# Patient Record
Sex: Female | Born: 1940 | Race: White | Hispanic: No | Marital: Married | Smoking: Never smoker
Health system: Southern US, Community
[De-identification: ages and names within clinical notes are randomized; demographics above are authoritative.]

## PROBLEM LIST (undated history)

## (undated) DIAGNOSIS — M1990 Unspecified osteoarthritis, unspecified site: Secondary | ICD-10-CM

## (undated) DIAGNOSIS — E559 Vitamin D deficiency, unspecified: Secondary | ICD-10-CM

## (undated) DIAGNOSIS — D126 Benign neoplasm of colon, unspecified: Secondary | ICD-10-CM

## (undated) DIAGNOSIS — E039 Hypothyroidism, unspecified: Secondary | ICD-10-CM

## (undated) DIAGNOSIS — M2550 Pain in unspecified joint: Secondary | ICD-10-CM

## (undated) DIAGNOSIS — D509 Iron deficiency anemia, unspecified: Secondary | ICD-10-CM

## (undated) DIAGNOSIS — R002 Palpitations: Secondary | ICD-10-CM

## (undated) DIAGNOSIS — R0602 Shortness of breath: Secondary | ICD-10-CM

## (undated) DIAGNOSIS — R5383 Other fatigue: Secondary | ICD-10-CM

## (undated) DIAGNOSIS — U071 COVID-19: Secondary | ICD-10-CM

## (undated) DIAGNOSIS — E079 Disorder of thyroid, unspecified: Secondary | ICD-10-CM

## (undated) DIAGNOSIS — I10 Essential (primary) hypertension: Secondary | ICD-10-CM

## (undated) DIAGNOSIS — E669 Obesity, unspecified: Secondary | ICD-10-CM

## (undated) DIAGNOSIS — M25475 Effusion, left foot: Secondary | ICD-10-CM

## (undated) DIAGNOSIS — G4730 Sleep apnea, unspecified: Secondary | ICD-10-CM

## (undated) DIAGNOSIS — M25474 Effusion, right foot: Secondary | ICD-10-CM

## (undated) DIAGNOSIS — R197 Diarrhea, unspecified: Secondary | ICD-10-CM

## (undated) DIAGNOSIS — K56609 Unspecified intestinal obstruction, unspecified as to partial versus complete obstruction: Secondary | ICD-10-CM

## (undated) DIAGNOSIS — M25472 Effusion, left ankle: Secondary | ICD-10-CM

## (undated) DIAGNOSIS — G5603 Carpal tunnel syndrome, bilateral upper limbs: Principal | ICD-10-CM

## (undated) DIAGNOSIS — E7800 Pure hypercholesterolemia, unspecified: Secondary | ICD-10-CM

## (undated) DIAGNOSIS — K5792 Diverticulitis of intestine, part unspecified, without perforation or abscess without bleeding: Secondary | ICD-10-CM

## (undated) DIAGNOSIS — R079 Chest pain, unspecified: Secondary | ICD-10-CM

## (undated) DIAGNOSIS — M25471 Effusion, right ankle: Secondary | ICD-10-CM

## (undated) DIAGNOSIS — H6120 Impacted cerumen, unspecified ear: Secondary | ICD-10-CM

## (undated) DIAGNOSIS — R252 Cramp and spasm: Secondary | ICD-10-CM

## (undated) DIAGNOSIS — K219 Gastro-esophageal reflux disease without esophagitis: Secondary | ICD-10-CM

## (undated) DIAGNOSIS — F439 Reaction to severe stress, unspecified: Secondary | ICD-10-CM

## (undated) DIAGNOSIS — K760 Fatty (change of) liver, not elsewhere classified: Secondary | ICD-10-CM

## (undated) DIAGNOSIS — F419 Anxiety disorder, unspecified: Secondary | ICD-10-CM

## (undated) HISTORY — DX: Shortness of breath: R06.02

## (undated) HISTORY — DX: Palpitations: R00.2

## (undated) HISTORY — DX: Cramp and spasm: R25.2

## (undated) HISTORY — DX: Sleep apnea, unspecified: G47.30

## (undated) HISTORY — PX: OTHER SURGICAL HISTORY: SHX169

## (undated) HISTORY — DX: Effusion, left ankle: M25.472

## (undated) HISTORY — DX: Disorder of thyroid, unspecified: E07.9

## (undated) HISTORY — DX: Gastro-esophageal reflux disease without esophagitis: K21.9

## (undated) HISTORY — DX: Effusion, right ankle: M25.471

## (undated) HISTORY — DX: Unspecified intestinal obstruction, unspecified as to partial versus complete obstruction: K56.609

## (undated) HISTORY — DX: Impacted cerumen, unspecified ear: H61.20

## (undated) HISTORY — DX: Vitamin D deficiency, unspecified: E55.9

## (undated) HISTORY — DX: Chest pain, unspecified: R07.9

## (undated) HISTORY — DX: Obesity, unspecified: E66.9

## (undated) HISTORY — DX: Carpal tunnel syndrome, bilateral upper limbs: G56.03

## (undated) HISTORY — PX: APPENDECTOMY: SHX54

## (undated) HISTORY — DX: Effusion, left ankle: M25.475

## (undated) HISTORY — DX: Effusion, right foot: M25.474

## (undated) HISTORY — DX: Diverticulitis of intestine, part unspecified, without perforation or abscess without bleeding: K57.92

## (undated) HISTORY — DX: Other fatigue: R53.83

## (undated) HISTORY — DX: Reaction to severe stress, unspecified: F43.9

## (undated) HISTORY — DX: Iron deficiency anemia, unspecified: D50.9

## (undated) HISTORY — DX: Unspecified osteoarthritis, unspecified site: M19.90

## (undated) HISTORY — PX: CATARACT EXTRACTION W/ INTRAOCULAR LENS IMPLANT: SHX1309

## (undated) HISTORY — DX: Benign neoplasm of colon, unspecified: D12.6

## (undated) HISTORY — DX: Diarrhea, unspecified: R19.7

## (undated) HISTORY — DX: Pain in unspecified joint: M25.50

## (undated) HISTORY — DX: COVID-19: U07.1

## (undated) HISTORY — DX: Anxiety disorder, unspecified: F41.9

## (undated) HISTORY — DX: Hypothyroidism, unspecified: E03.9

## (undated) HISTORY — DX: Pure hypercholesterolemia, unspecified: E78.00

## (undated) HISTORY — PX: TOE SURGERY: SHX1073

## (undated) HISTORY — DX: Fatty (change of) liver, not elsewhere classified: K76.0

## (undated) HISTORY — DX: Essential (primary) hypertension: I10

## (undated) HISTORY — PX: COLONOSCOPY: SHX174

---

## 1984-02-20 HISTORY — PX: ABDOMINAL HYSTERECTOMY: SHX81

## 1997-05-31 ENCOUNTER — Ambulatory Visit (HOSPITAL_COMMUNITY): Admission: RE | Admit: 1997-05-31 | Discharge: 1997-05-31 | Payer: Self-pay | Admitting: Internal Medicine

## 1998-01-06 ENCOUNTER — Inpatient Hospital Stay (HOSPITAL_COMMUNITY): Admission: EM | Admit: 1998-01-06 | Discharge: 1998-01-08 | Payer: Self-pay | Admitting: Emergency Medicine

## 1998-01-09 ENCOUNTER — Inpatient Hospital Stay (HOSPITAL_COMMUNITY): Admission: EM | Admit: 1998-01-09 | Discharge: 1998-01-11 | Payer: Self-pay | Admitting: Emergency Medicine

## 2003-01-28 ENCOUNTER — Encounter (INDEPENDENT_AMBULATORY_CARE_PROVIDER_SITE_OTHER): Payer: Self-pay | Admitting: *Deleted

## 2003-01-28 ENCOUNTER — Ambulatory Visit (HOSPITAL_COMMUNITY): Admission: RE | Admit: 2003-01-28 | Discharge: 2003-01-28 | Payer: Self-pay | Admitting: *Deleted

## 2003-02-13 ENCOUNTER — Ambulatory Visit (HOSPITAL_COMMUNITY): Admission: RE | Admit: 2003-02-13 | Discharge: 2003-02-13 | Payer: Self-pay | Admitting: Neurosurgery

## 2004-07-08 ENCOUNTER — Emergency Department (HOSPITAL_COMMUNITY): Admission: EM | Admit: 2004-07-08 | Discharge: 2004-07-08 | Payer: Self-pay | Admitting: Emergency Medicine

## 2009-04-01 ENCOUNTER — Ambulatory Visit: Payer: Self-pay | Admitting: Family Medicine

## 2009-04-01 DIAGNOSIS — E039 Hypothyroidism, unspecified: Secondary | ICD-10-CM | POA: Insufficient documentation

## 2009-04-01 DIAGNOSIS — J209 Acute bronchitis, unspecified: Secondary | ICD-10-CM

## 2009-04-01 DIAGNOSIS — R05 Cough: Secondary | ICD-10-CM

## 2009-04-01 DIAGNOSIS — Z8719 Personal history of other diseases of the digestive system: Secondary | ICD-10-CM

## 2009-04-01 DIAGNOSIS — R059 Cough, unspecified: Secondary | ICD-10-CM | POA: Insufficient documentation

## 2009-04-01 DIAGNOSIS — I10 Essential (primary) hypertension: Secondary | ICD-10-CM | POA: Insufficient documentation

## 2009-04-01 DIAGNOSIS — K5730 Diverticulosis of large intestine without perforation or abscess without bleeding: Secondary | ICD-10-CM | POA: Insufficient documentation

## 2009-04-04 ENCOUNTER — Ambulatory Visit: Payer: Self-pay | Admitting: Occupational Medicine

## 2009-04-04 ENCOUNTER — Telehealth (INDEPENDENT_AMBULATORY_CARE_PROVIDER_SITE_OTHER): Payer: Self-pay

## 2009-08-02 IMAGING — US MAMMO-LUNI-US
1 series · 14 of 16 positions shown · non-contrast
Comparison: NONE

CLINICAL DATA: BERGLUND.C.Oneill RT(R)(M)(CT)   Diagnostic Mammogram.  
Question  of increasing left breast asymmetry versus compression 
differences  noted on screening mammogram dated 04/30/2007. 

LEFT BREAST MAMMOGRAM ADDITIONAL VIEWS AND LEFT BREAST ULTRASOUND

[Series 1: us left breast · 0.07mm/px · 14 of 20 slices shown]
[im 1/20]
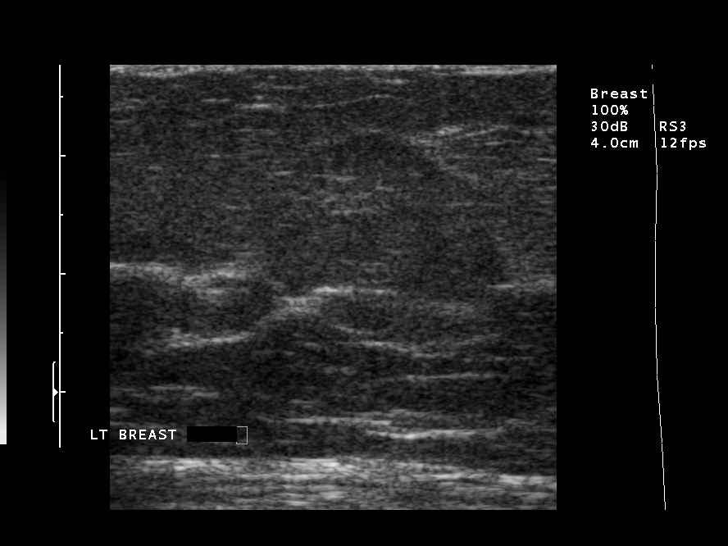
[im 2/20]
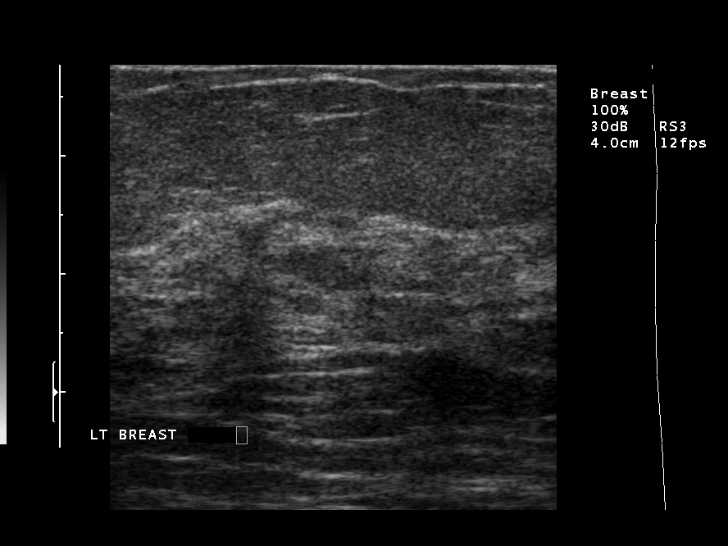
[im 3/20]
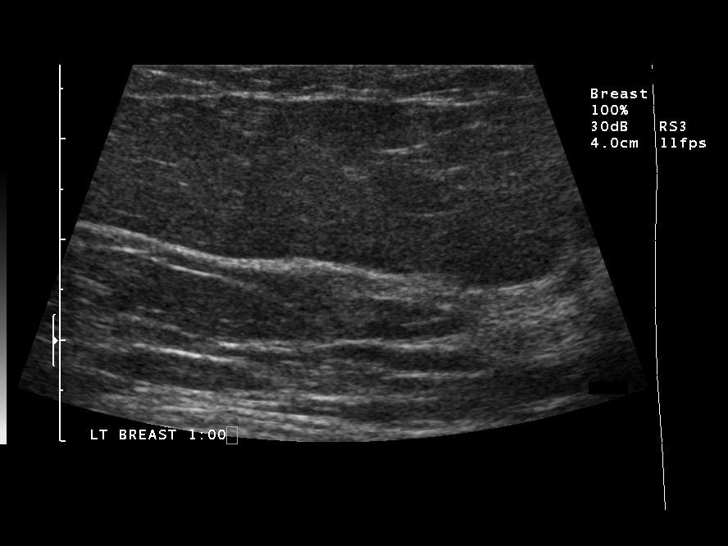
[im 6/20]
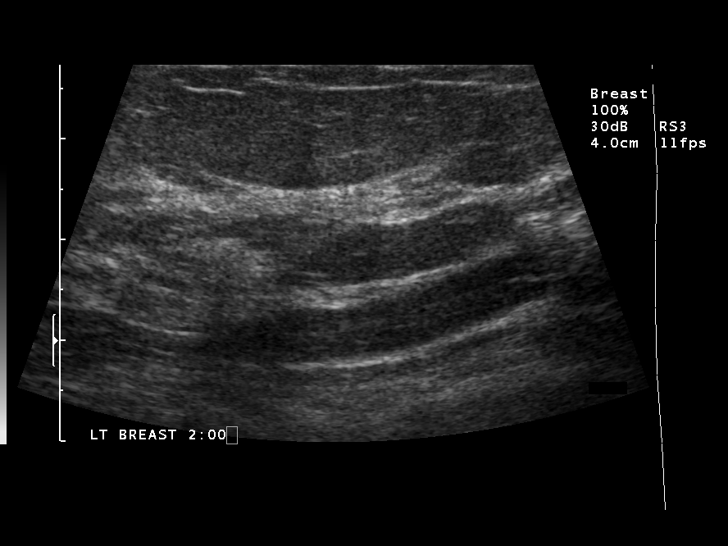
[im 7/20]
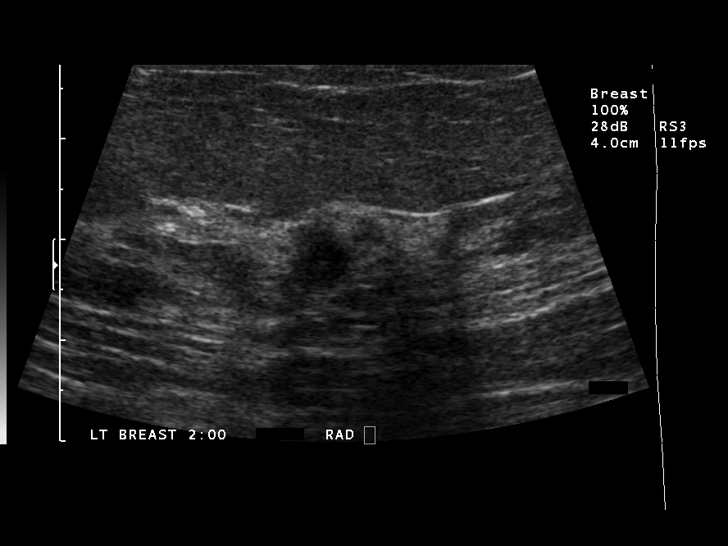
[im 8/20]
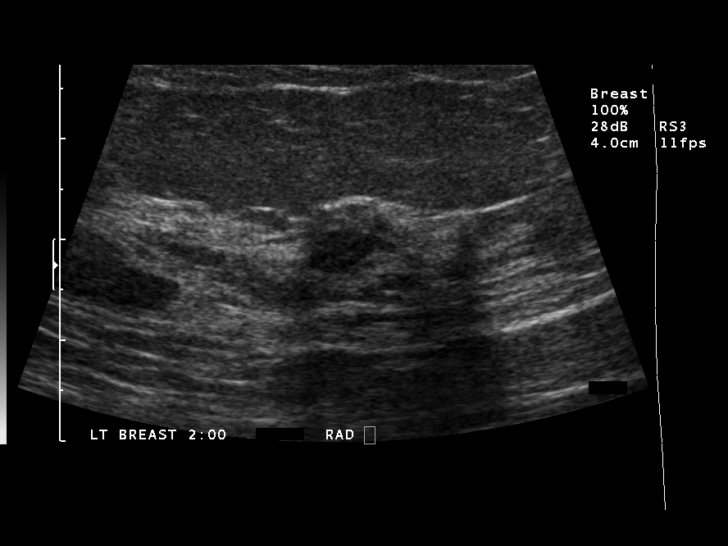
[im 9/20]
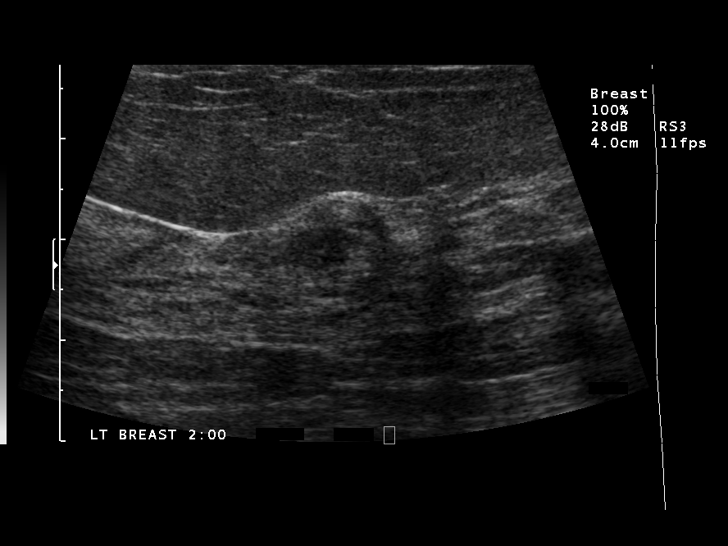
[im 11/20]
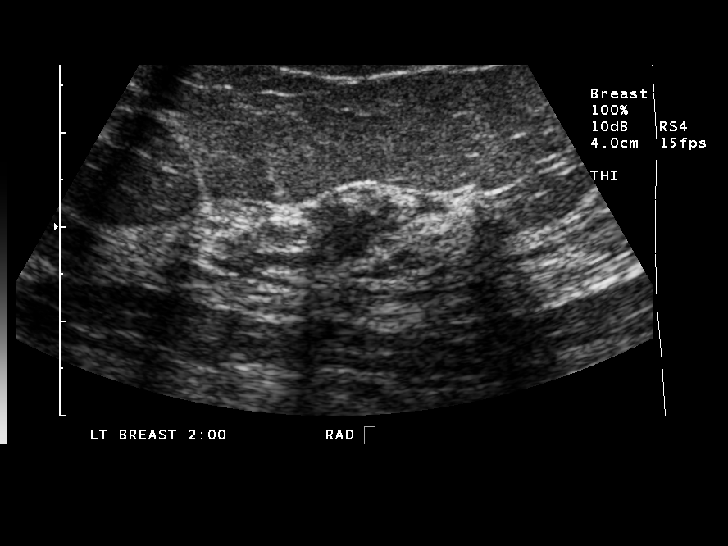
[im 12/20]
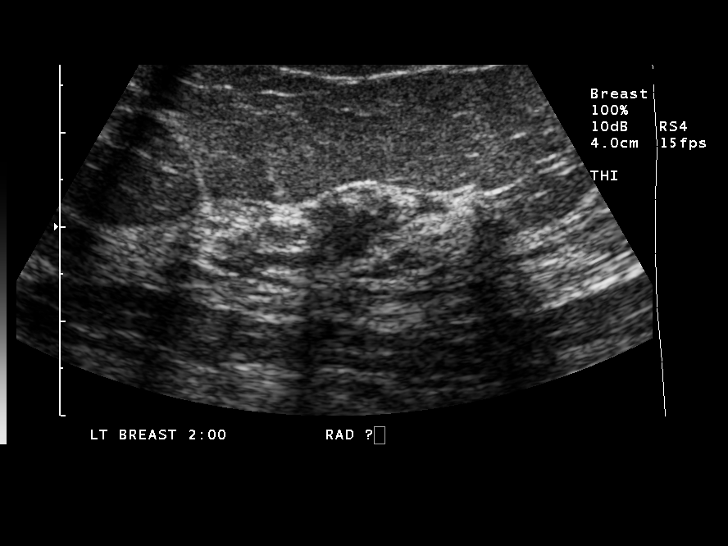
[im 13/20]
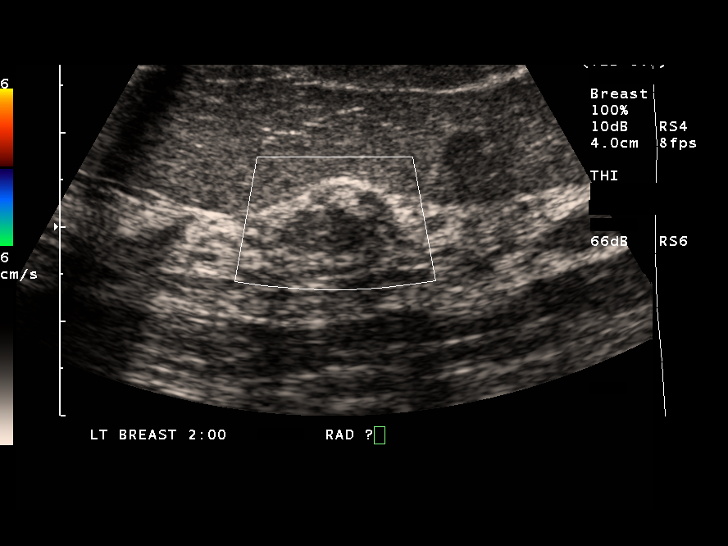
[im 16/20]
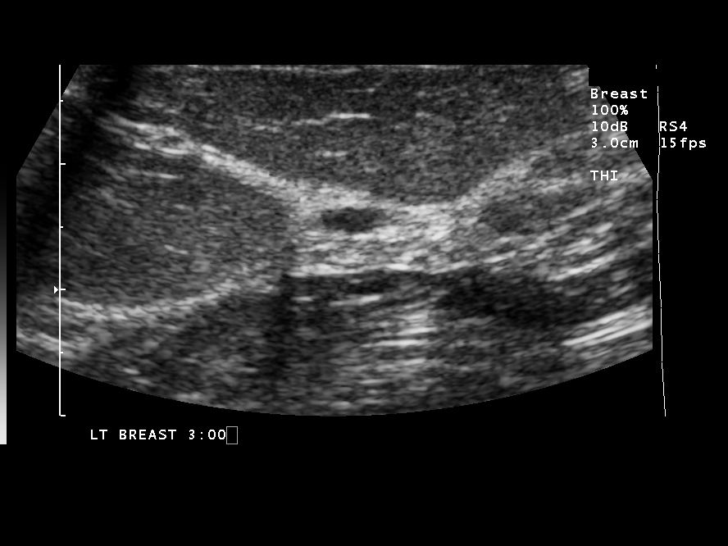
[im 17/20]
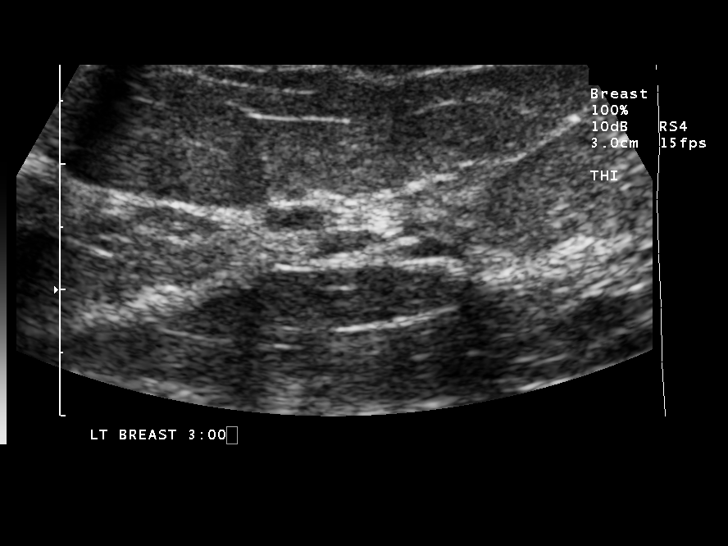
[im 18/20]
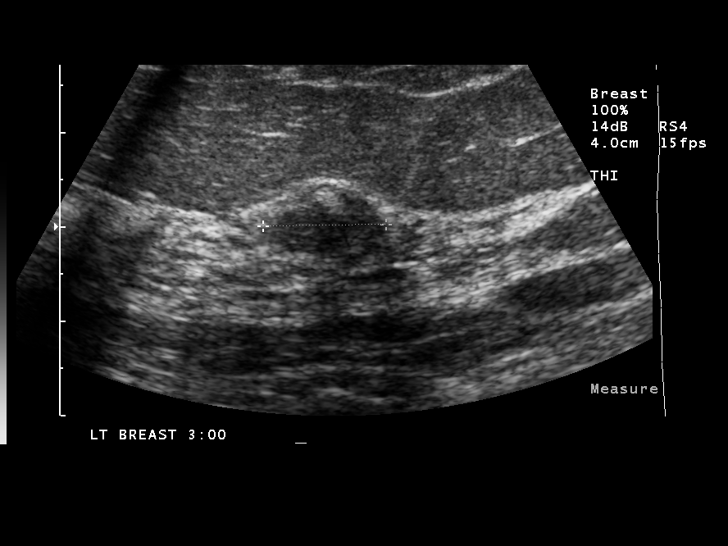
[im 20/20]
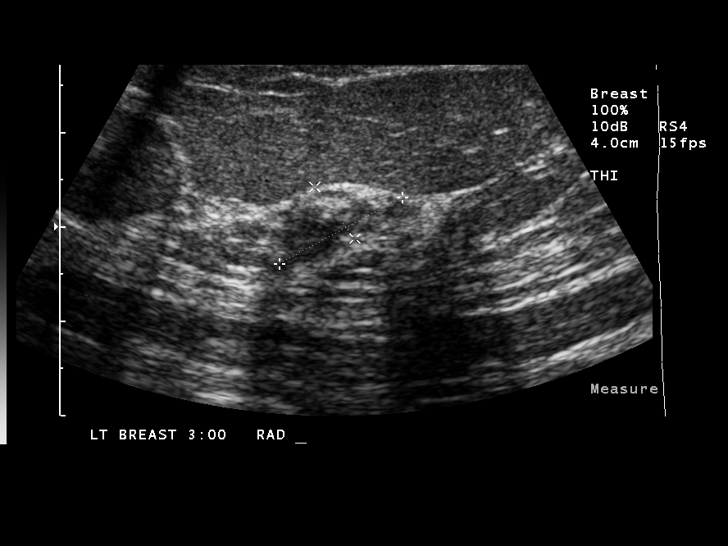

[14 of 16 positions shown; findings below may reference images not displayed]

FINDINGS: True lateral view of the left breast demonstrates an 
area of probable fibroglandular asymmetry related to fibrocystic 
changes.  No architectural distortion, spiculation, suspicious 
mass or microcalcification. Ultrasound demonstrates an area of 
altered echogenicity most likely reflecting fibrocystic change.  
No other abnormality.
IMPRESSION: Probable benign change in the left breast.  
Follow-up left breast mammogram and ultrasound in six-months is 
recommended. The patient was informed at the time of the 
examination of the findings and recommendations by verbal and 
written lay report. Computer assisted (Second Look) technology was 
used as an aid in interpretation of this study. BI-RADS 3 - 
Probably Benign Nickelsen, Jeppe Juel electronically reviewed on 
05/08/2007 Dict Date: 05/08/2007  Tran Date:  05/08/2007 INGUNN HARPA RONLOR

## 2010-01-26 ENCOUNTER — Ambulatory Visit (HOSPITAL_COMMUNITY)
Admission: RE | Admit: 2010-01-26 | Discharge: 2010-01-26 | Payer: Self-pay | Source: Home / Self Care | Attending: Gastroenterology | Admitting: Gastroenterology

## 2010-03-23 NOTE — Assessment & Plan Note (Signed)
Summary: Allergic Reaction to antibiotic rm 1   Vital Signs:  Patient Profile:   70 Years Old Female CC:      Cold & URI symptoms Height:     63.5 inches Weight:      191 pounds O2 Sat:      94 % O2 treatment:    Room Air Temp:     97.4 degrees F oral Pulse rate:   70 / minute Pulse rhythm:   regular Resp:     15 per minute BP sitting:   138 / 92  (right arm) Cuff size:   regular  Vitals Entered By: Areta Haber CMA (April 04, 2009 10:26 AM)                  Current Allergies: ! * UNKNOWN   History of Present Illness Chief Complaint: Cold & URI symptoms History of Present Illness: Seen here three days ago with flu like symptoms and complaints of cough/fever.   Non smoker.   She was prescribed Omnicef and was ablt to take it for 3 days.   Yesterday she developed swelling of her lips and chelosis.   She stopped the Omnicef.   Still complaining of dry cough and cough keeps her up at night.   She does not have an anti tussive.   No prior history of asthma.   No fever in 3-4 days.   Current Problems: ALLERGIC REACTION (ICD-995.3) COUGH (ICD-786.2) ACUTE BRONCHITIS (ICD-466.0) COUGH (ICD-786.2) HYPOTHYROIDISM (ICD-244.9) HYPERTENSION (ICD-401.9) DIVERTICULITIS, HX OF (ICD-V12.79)   Current Meds SYNTHROID 25 MCG TABS (LEVOTHYROXINE SODIUM) once daily DIOVAN HCT 80-12.5 MG TABS (VALSARTAN-HYDROCHLOROTHIAZIDE) once daily DELSYM NIGHT TIME COUGH/COLD 15-6.25-500 MG/15ML LIQD (DM-DOXYLAMINE-ACETAMINOPHEN) prn CEFDINIR 300 MG CAPS (CEFDINIR) 1 by mouth q12hr BENZONATATE 200 MG CAPS (BENZONATATE) One by mouth hs as needed cough CHERATUSSIN DAC 30-10-100 MG/5ML SOLN (PSEUDOEPHEDRINE-CODEINE-GG) 5 ml at bedtime for cough MAXAIR AUTOHALER 200 MCG/INH AERB (PIRBUTEROL ACETATE) 1 - 2 puffs q4 to 6hr prn  REVIEW OF SYSTEMS Constitutional Symptoms      Denies fever, chills, night sweats, weight loss, weight gain, and fatigue.  Eyes       Denies change in vision, eye pain,  eye discharge, glasses, contact lenses, and eye surgery. Ear/Nose/Throat/Mouth       Denies hearing loss/aids, change in hearing, ear pain, ear discharge, dizziness, frequent runny nose, frequent nose bleeds, sinus problems, sore throat, hoarseness, and tooth pain or bleeding.  Respiratory       Complains of dry cough.      Denies productive cough, wheezing, shortness of breath, asthma, bronchitis, and emphysema/COPD.  Cardiovascular       Denies murmurs, chest pain, and tires easily with exhertion.    Gastrointestinal       Denies stomach pain, nausea/vomiting, diarrhea, constipation, blood in bowel movements, and indigestion. Genitourniary       Denies painful urination, kidney stones, and loss of urinary control. Neurological       Denies paralysis, seizures, and fainting/blackouts. Musculoskeletal       Denies muscle pain, joint pain, joint stiffness, decreased range of motion, redness, swelling, muscle weakness, and gout.  Skin       Denies bruising, unusual mles/lumps or sores, and hair/skin or nail changes.      Comments: Allergic Reaction  Psych       Denies mood changes, temper/anger issues, anxiety/stress, speech problems, depression, and sleep problems. Other Comments: Pt states that broke out around mouth, lips, inside of mouth on  Saturday 04/02/09.  Pt states she feels she's having an allergic reaction to antibiotic.   Past History:  Past Medical History: Last updated: 04/01/2009 Diverticulitis, hx of Hypertension Hypothyroidism  Past Surgical History: Last updated: 04/01/2009 Hysterectomy-1987  Family History: Last updated: 04/01/2009 MI-father  Social History: Last updated: 04/01/2009 Occupation:[employer] Married Never Smoked Alcohol use-no Drug use-no Regular exercise-yes  Risk Factors: Exercise: yes (04/01/2009)  Risk Factors: Smoking Status: never (04/01/2009) Physical Exam General appearance: well developed, well nourished, no acute  distress Oral/Pharynx: swelling of the upper lip.   Some chelosis noted.   No airway compromise.   Normal tonsils and posterior pharynx.  Chest/Lungs: no rales, wheezes, or rhonchi bilateral, breath sounds equal without effort Heart: regular rate and  rhythm, no murmur Assessment New Problems: ALLERGIC REACTION (ICD-995.3) COUGH (ICD-786.2)   Plan New Medications/Changes: MAXAIR AUTOHALER 200 MCG/INH AERB (PIRBUTEROL ACETATE) 1 - 2 puffs q4 to 6hr prn  #1 MDI x 0, 04/04/2009, Kathrine Haddock MD CHERATUSSIN DAC 30-10-100 MG/5ML SOLN (PSEUDOEPHEDRINE-CODEINE-GG) 5 ml at bedtime for cough  #4oz x 0, 04/04/2009, Kathrine Haddock MD  New Orders: Est. Patient Level III [phone] Planning Comments:   Maxair  MDI 2 pufs every 6 hours for cough Cheratussin at night Stop Omnicef Somumedrol 125mg  IM now Follow up if symptoms do not improve in 3-5 days.   The patient and/or caregiver has been counseled thoroughly with regard to medications prescribed including dosage, schedule, interactions, rationale for use, and possible side effects and they verbalize understanding.  Diagnoses and expected course of recovery discussed and will return if not improved as expected or if the condition worsens. Patient and/or caregiver verbalized understanding.  Prescriptions: MAXAIR AUTOHALER 200 MCG/INH AERB (PIRBUTEROL ACETATE) 1 - 2 puffs q4 to 6hr prn  #1 MDI x 0   Entered and Authorized by:   Kathrine Haddock MD   Signed by:   Kathrine Haddock MD on 04/04/2009   Method used:   Print then Give to Patient   RxID:   [id number] CHERATUSSIN DAC 30-10-100 MG/5ML SOLN (PSEUDOEPHEDRINE-CODEINE-GG) 5 ml at bedtime for cough  #4oz x 0   Entered and Authorized by:   Kathrine Haddock MD   Signed by:   Kathrine Haddock MD on 04/04/2009   Method used:   Print then Give to Patient   RxID:   [id number]   Appended Document: Allergic Reaction to antibiotic rm 1 Solu Medrol 125mg  IM R Glut @ 11:25am LOT#  [id number]   EXP:   10/2011  MANUF: Pharmacia Pt Tolerated injection well.

## 2010-03-23 NOTE — Assessment & Plan Note (Signed)
Summary: COUGH/KH   Vital Signs:  Patient Profile:   70 Years Old Female CC:      productive cough, wheezing, hoarsness, fever, HA X 5 days Height:     63.5 inches Weight:      190 pounds O2 Sat:      96 % O2 treatment:    Room Air Temp:     99.0 degrees F oral Pulse rate:   88 / minute Pulse rhythm:   regular Resp:     18 per minute BP sitting:   122 / 89  (right arm) Cuff size:   regular  Pt. in pain?   no  Vitals Entered By: Lita Mains, RN                   Updated Prior Medication List: SYNTHROID 25 MCG TABS (LEVOTHYROXINE SODIUM) once daily DIOVAN HCT 80-12.5 MG TABS (VALSARTAN-HYDROCHLOROTHIAZIDE) once daily DELSYM NIGHT TIME COUGH/COLD 15-6.25-500 MG/15ML LIQD (DM-DOXYLAMINE-ACETAMINOPHEN) prn  Current Allergies: ! Roque Lias of Present Illness Chief Complaint: productive cough, wheezing, hoarsness, fever, HA X 5 days History of Present Illness: Subjective: Patient complains of onset of flu-like URI symptoms 5 days ago, and now has persistent low grade fever.  Past history of pneumonia about 3 years ago.  She has had a flu shot this season and a Pneumovax in the past.  Non-smoker. + sore throat resolved + cough worse at night, productive No pleuritic pain No wheezing + nasal congestion + post-nasal drainage No sinus pain/pressure No itchy/red eyes No earache No hemoptysis No SOB + fever/chills No nausea No vomiting No abdominal pain No diarrhea No skin rashes + fatigue + myalgias + headache Used OTC meds without relief   REVIEW OF SYSTEMS Constitutional Symptoms       Complains of fever and fatigue.     Denies chills, night sweats, weight loss, and weight gain.  Eyes       Denies change in vision, eye pain, eye discharge, glasses, contact lenses, and eye surgery. Ear/Nose/Throat/Mouth       Complains of sinus problems, sore throat, and hoarseness.      Denies hearing loss/aids, change in hearing, ear pain, ear discharge, dizziness,  frequent runny nose, frequent nose bleeds, and tooth pain or bleeding.  Respiratory       Complains of productive cough.      Denies dry cough, wheezing, shortness of breath, asthma, bronchitis, and emphysema/COPD.  Cardiovascular       Denies murmurs, chest pain, and tires easily with exhertion.    Gastrointestinal       Denies stomach pain, nausea/vomiting, diarrhea, constipation, blood in bowel movements, and indigestion. Genitourniary       Denies painful urination, kidney stones, and loss of urinary control. Neurological       Complains of headaches.      Denies paralysis, seizures, and fainting/blackouts. Musculoskeletal       Denies muscle pain, joint pain, joint stiffness, decreased range of motion, redness, swelling, muscle weakness, and gout.  Skin       Denies bruising, unusual mles/lumps or sores, and hair/skin or nail changes.  Psych       Denies mood changes, temper/anger issues, anxiety/stress, speech problems, depression, and sleep problems.  Past History:  Past Medical History: Diverticulitis, hx of Hypertension Hypothyroidism  Past Surgical History: Hysterectomy-1987  Family History: MI-father  Social History: Occupation:[employer] Married Never Smoked Alcohol use-no Drug use-no Regular exercise-yes Smoking Status:  never Does Patient Exercise:  yes Drug  Use:  no   Objective:  No acute distress  Eyes:  Pupils are equal, round, and reactive to light and accomdation.  Extraocular movement is intact.  Conjunctivae are not inflamed.  Ears:  Canals normal.  Tympanic membranes normal.   Nose:  Normal septum.  Normal turbinates, mildly congested.   No sinus tenderness present.  Pharynx:  Normal  Neck:  Supple.  No adenopathy is present.   Lungs:  Clear to auscultation.  Breath sounds are equal.  Heart:  Regular rate and rhythm without murmurs, rubs, or gallops.  Abdomen:  Nontender without masses or hepatosplenomegaly.  Bowel sounds are present.  No CVA or  flank tenderness.  Extremities:  No edema.   Chest X-ray:  chronic bronchitic markings, no acute infiltrate Assessment New Problems: ACUTE BRONCHITIS (ICD-466.0) COUGH (ICD-786.2) HYPOTHYROIDISM (ICD-244.9) HYPERTENSION (ICD-401.9) DIVERTICULITIS, HX OF (ICD-V12.79)  SUSPECT BRONCHITIS  Plan New Medications/Changes: BENZONATATE 200 MG CAPS (BENZONATATE) One by mouth hs as needed cough  #12 x 0, 04/01/2009, Donna Christen MD CEFDINIR 300 MG CAPS (CEFDINIR) 1 by mouth q12hr  #20 x 0, 04/01/2009, Donna Christen MD  New Orders: T-Chest x-ray, 2 views [71020] New Patient Level III [99203] Planning Comments:   Begin Omnicef,  expectorant, cough suppressant at night, rest, increased fluids. Follow-up with PCP if not improving   The patient and/or caregiver has been counseled thoroughly with regard to medications prescribed including dosage, schedule, interactions, rationale for use, and possible side effects and they verbalize understanding.  Diagnoses and expected course of recovery discussed and will return if not improved as expected or if the condition worsens. Patient and/or caregiver verbalized understanding.  Prescriptions: BENZONATATE 200 MG CAPS (BENZONATATE) One by mouth hs as needed cough  #12 x 0   Entered and Authorized by:   Donna Christen MD   Signed by:   Donna Christen MD on 04/01/2009   Method used:   Print then Give to Patient   RxID:   [id number] CEFDINIR 300 MG CAPS (CEFDINIR) 1 by mouth q12hr  #20 x 0   Entered and Authorized by:   Donna Christen MD   Signed by:   Donna Christen MD on 04/01/2009   Method used:   Print then Give to Patient   RxID:   [id number]   Patient Instructions: 1)  Use Mucinex  (guaifenesin) twice daily for congestion. 2)  Increase fluid intake, rest. 3)  May use Afrin nasal spray (or generic oxymetazoline) twice daily for about 5 days.  Also recommend using saline nasal spray several times daily and/or saline nasal irrigation. 4)   Use Vick's and a vaporizor at night. 5)  Followup with family doctor if not improving 7 to 10 days.

## 2010-03-23 NOTE — Progress Notes (Signed)
  Phone Note Call from Patient   Caller: Patient Summary of Call: Recvd cll from pt@8 :31am. Clld pt back - Pt states that she was seen Friday, 04/01/09 by Dr. Cathren Harsh for fever, coughing, and headache x 5 dys. Pt states that she was Dx with Bronchitis and prescribed an antibiotic and something for her cough. Pt states that on Saturday, she started itching and today she has blisters in her mouth, broken out on her body like a rash. I asked the patient if she has had any trouble breathing - she stated no just does not feel well at all. I advised the pt to discontinue taking the antibiotic andit was okay to take Benadryl as directed on the bottle but that we really needed her to come in to be seen. Pt stated that she really did not feel like coming in and that she was not happy with the prescribed medication. Pt stated that her son-ini law was seen after her and she feels he was given better medication than her, I advised the patient that Dr. Cathren Harsh prescribed the medication due to his evaluation that was done on her. Pt stated she understood and if she like coming in she would later on. I advised the pt that we really needed her to come in as soon as possible for proper evaluation and Dx with her allergic reaction to current medications. Pt stated she understood but would think about it. I stated okay that was to her discretion but after speaking to Dr. Alto Denver, we could not DX her over the phone and hoped she would decide to come in to be seen.      Initial call taken by: Areta Haber CMA,  April 04, 2009 10:02 AM

## 2010-07-07 NOTE — Op Note (Signed)
NAMEJANEICE, Toni Jacobs                        ACCOUNT NO.:  [id number]   MEDICAL RECORD NO.:  [id number]                   PATIENT TYPE:  AMB   LOCATION:  ENDO                                 FACILITY:  [facility]   PHYSICIAN:  Danise Edge, M.D.                DATE OF BIRTH:  1940-07-01   DATE OF PROCEDURE:  DATE OF DISCHARGE:                                 OPERATIVE REPORT   PROCEDURE:  Colonoscopy.   INDICATIONS FOR PROCEDURE:  The patient is a 70 year old female born  07-Feb-1941.  [name] has been treated multiple times for  acute diverticulitis.  She has had an episode of lower gastrointestinal  bleeding.  She is scheduled to undergo a diagnostic colonoscopy with  polypectomy to prevent colon cancer.   ENDOSCOPIST:  Danise Edge, M.D.   PREMEDICATION:  Demerol 50 mg, Versed 10 mg.   PROCEDURE:  After obtaining informed consent the patient was placed in the  left lateral decubitus position.  I administered intravenous Demerol and  intravenous Versed to achieve conscious sedation for the procedure.  The  patient's blood pressure, oxygen saturation and cardiac rhythm were  monitored throughout the procedure and documented in the medical record.   Anal inspection was normal.  Digital rectal examination was normal.  The  Olympus adjustable pediatric colonoscope was introduced into the rectum and  easily advanced to the cecum.  The colonic preparation for the exam today  was excellent.   The patient has universal colonic diverticulosis; she has extensive left  colonic diverticulosis without diverticulitis or diverticular stricture  formation.  She has scattered diverticula involving the transverse colon and  right colon.   Rectum:  From the distal rectum, a 1 mm sessile polyp was removed with cold  biopsy forceps.   Sigmoid colon and descending colon:  Normal.   Splenic flexure:  Normal.   Transverse colon:  Normal.   Hepatic flexure:  Normal.   Ascending colon:  Normal.   Cecum and ileocecal valve:  Normal.   ASSESSMENT:  1. A diminutive polyp was removed from the distal rectum with the cold     biopsy forceps.  2. Universal colonic diverticulosis without diverticulitis or diverticular     stricture formation.                                               Danise Edge, M.D.    MJ/MEDQ  D:  01/28/2003  T:  01/28/2003  Job:  [id number]   cc:   Candyce Churn, M.D.  [address] [place]  [place] [address]  Fax: [phone]

## 2011-03-30 DIAGNOSIS — H02839 Dermatochalasis of unspecified eye, unspecified eyelid: Secondary | ICD-10-CM | POA: Diagnosis not present

## 2011-06-07 DIAGNOSIS — H5340 Unspecified visual field defects: Secondary | ICD-10-CM | POA: Diagnosis not present

## 2011-06-07 DIAGNOSIS — H02429 Myogenic ptosis of unspecified eyelid: Secondary | ICD-10-CM | POA: Diagnosis not present

## 2011-06-07 DIAGNOSIS — H04129 Dry eye syndrome of unspecified lacrimal gland: Secondary | ICD-10-CM | POA: Diagnosis not present

## 2011-06-07 DIAGNOSIS — H02839 Dermatochalasis of unspecified eye, unspecified eyelid: Secondary | ICD-10-CM | POA: Diagnosis not present

## 2011-06-27 IMAGING — CR DG CHEST 2V
2 series · 2 of 2 positions shown · non-contrast
Comparison: None.

CLINICAL DATA: Persistent cough and fever.

CHEST - 2 VIEW

[view not recorded (1 of 2)]
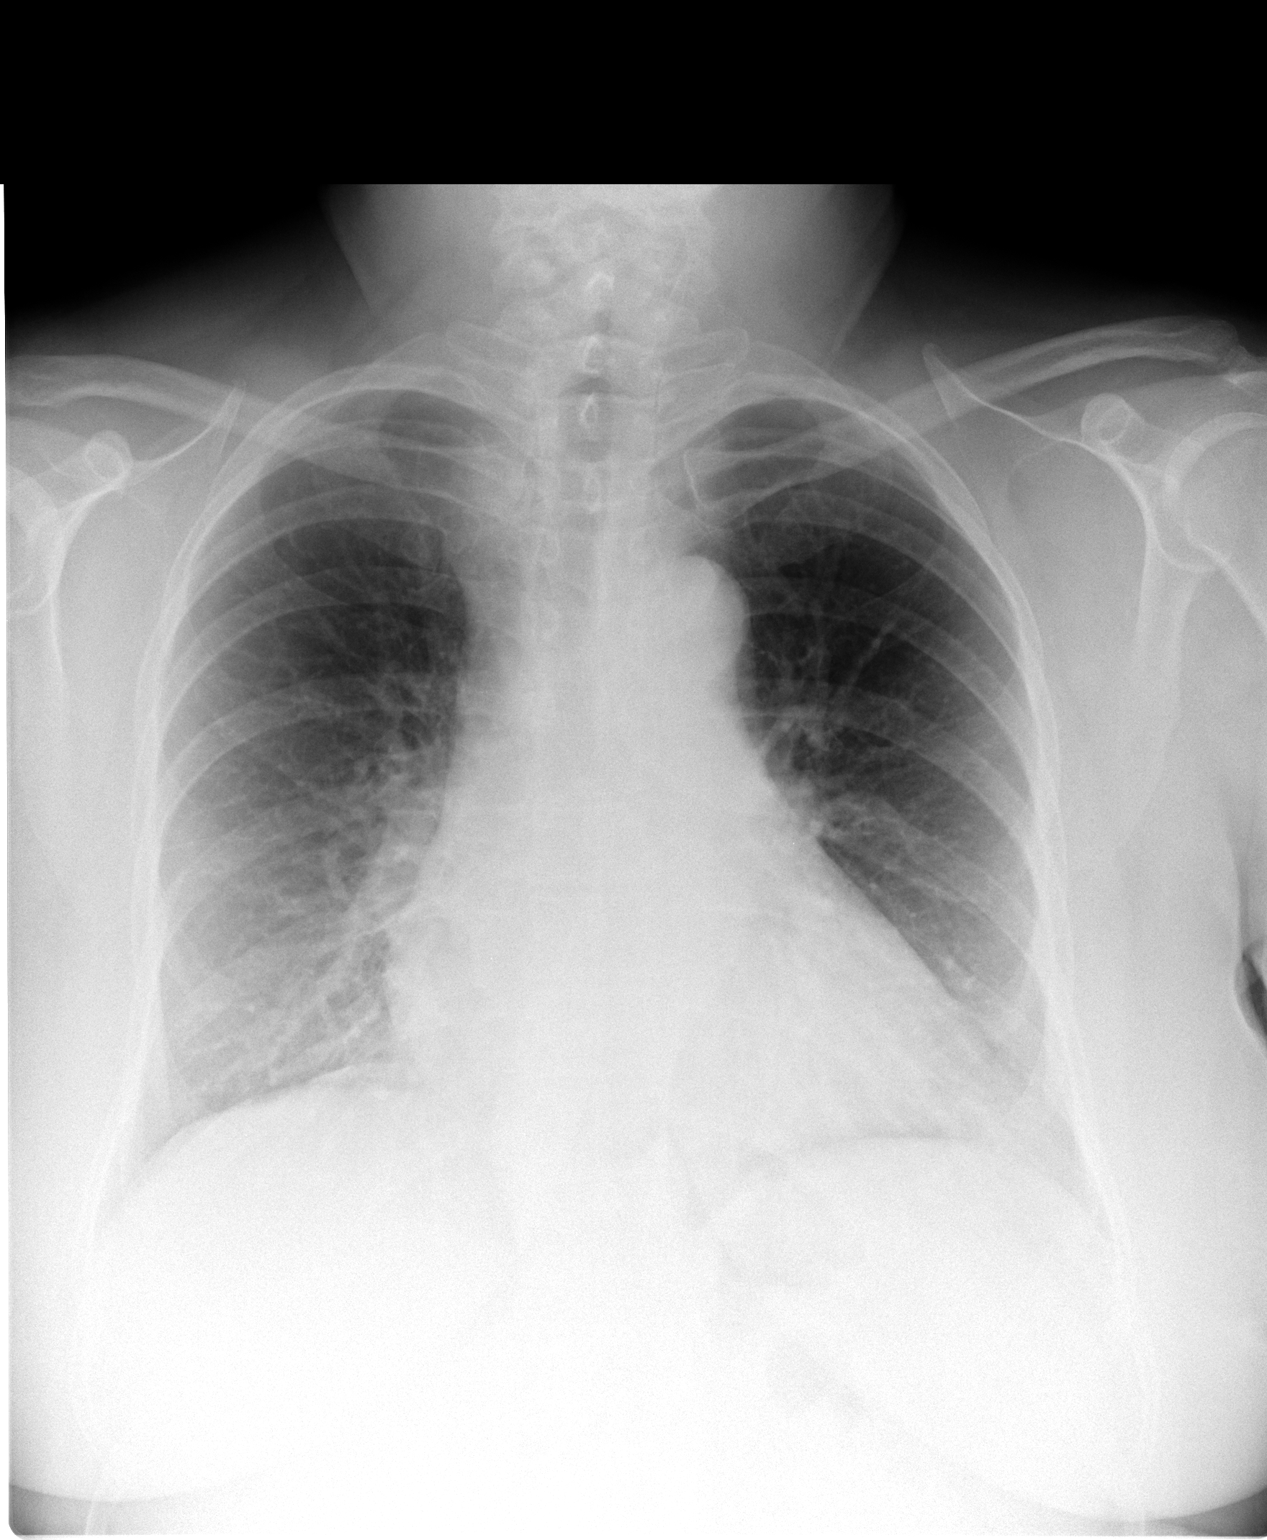

[view not recorded (2 of 2)]
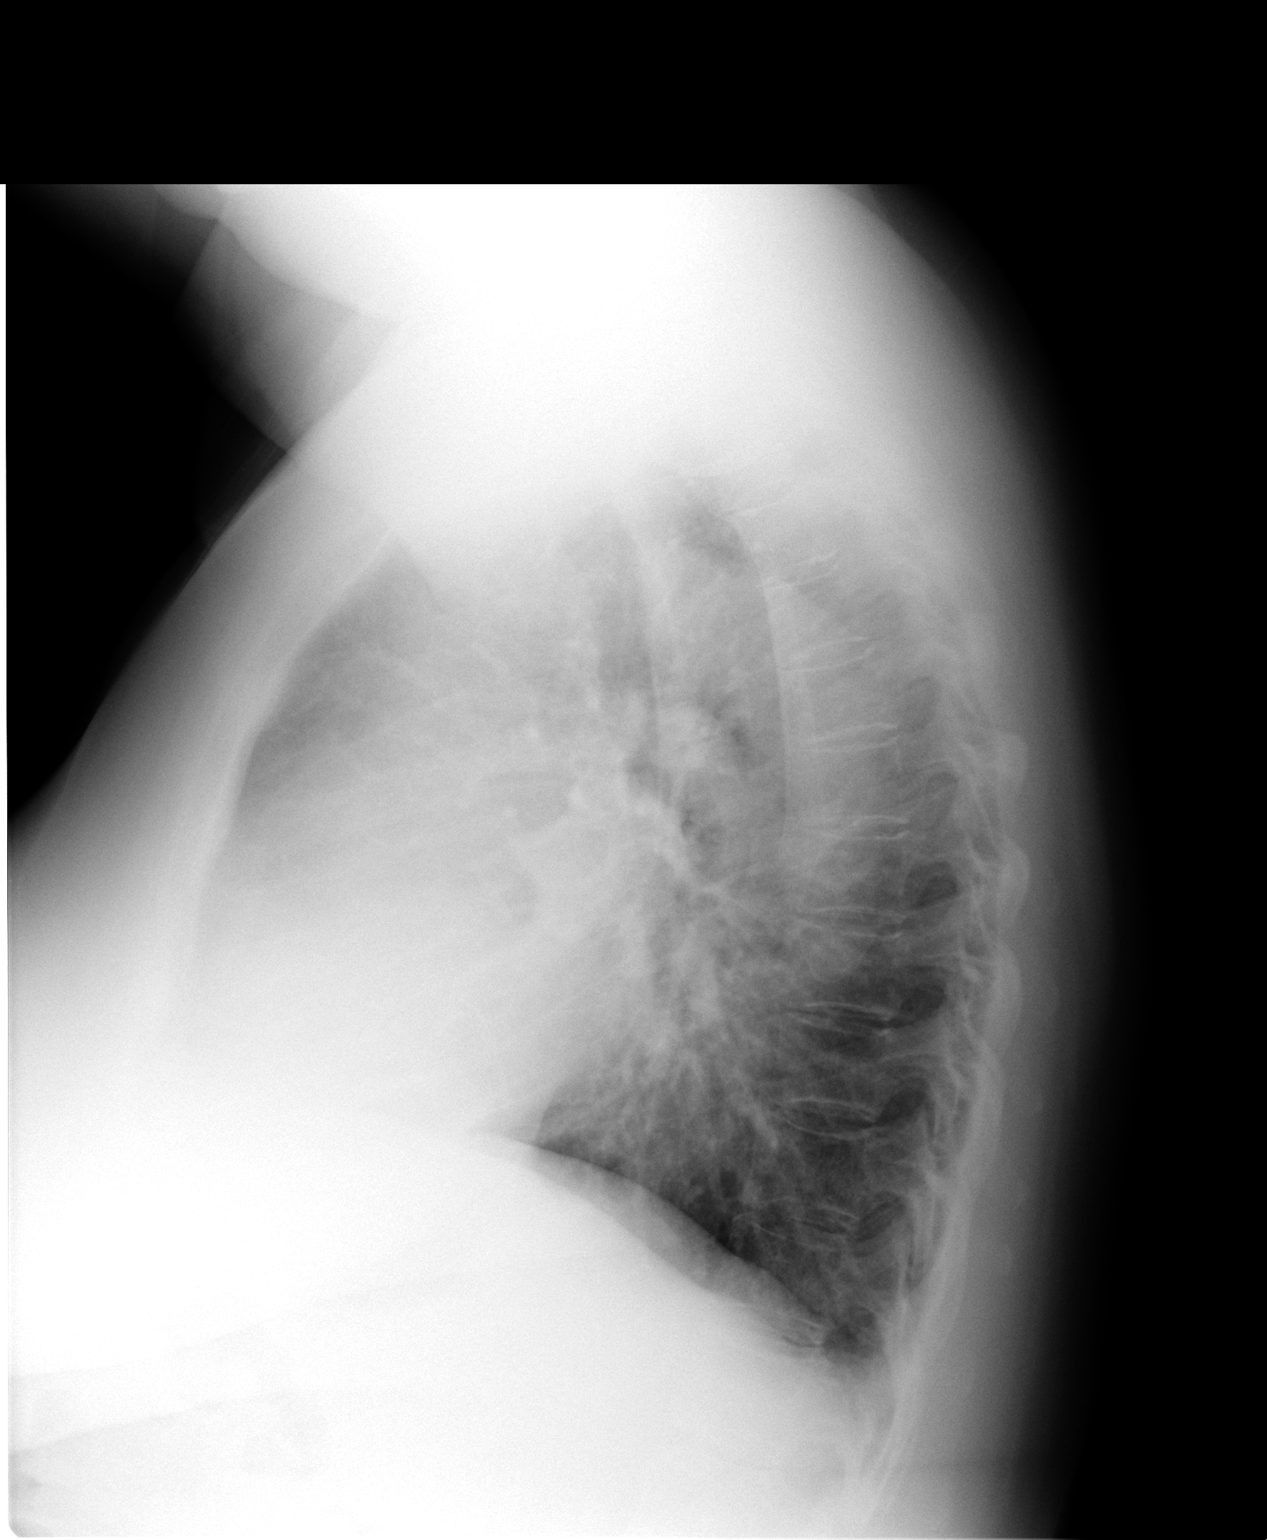

[2 of 2 positions shown; findings below may reference images not displayed]

FINDINGS: Cardiomegaly.  Accentuation of peribronchial markings
compatible with chronic bronchitic changes.  No active infiltrate.
Bony thorax intact.
IMPRESSION: Cardiomegaly.  Chronic bronchitic markings.

## 2011-07-21 DIAGNOSIS — K089 Disorder of teeth and supporting structures, unspecified: Secondary | ICD-10-CM | POA: Diagnosis not present

## 2011-07-21 DIAGNOSIS — E039 Hypothyroidism, unspecified: Secondary | ICD-10-CM | POA: Diagnosis not present

## 2011-07-21 DIAGNOSIS — I10 Essential (primary) hypertension: Secondary | ICD-10-CM | POA: Diagnosis not present

## 2011-08-16 DIAGNOSIS — E039 Hypothyroidism, unspecified: Secondary | ICD-10-CM | POA: Diagnosis not present

## 2011-08-16 DIAGNOSIS — E669 Obesity, unspecified: Secondary | ICD-10-CM | POA: Diagnosis not present

## 2011-08-16 DIAGNOSIS — Z1331 Encounter for screening for depression: Secondary | ICD-10-CM | POA: Diagnosis not present

## 2011-08-16 DIAGNOSIS — G4710 Hypersomnia, unspecified: Secondary | ICD-10-CM | POA: Diagnosis not present

## 2011-08-16 DIAGNOSIS — E782 Mixed hyperlipidemia: Secondary | ICD-10-CM | POA: Diagnosis not present

## 2011-08-16 DIAGNOSIS — I10 Essential (primary) hypertension: Secondary | ICD-10-CM | POA: Diagnosis not present

## 2011-08-16 DIAGNOSIS — N390 Urinary tract infection, site not specified: Secondary | ICD-10-CM | POA: Diagnosis not present

## 2011-08-28 DIAGNOSIS — H02839 Dermatochalasis of unspecified eye, unspecified eyelid: Secondary | ICD-10-CM | POA: Insufficient documentation

## 2011-08-28 DIAGNOSIS — H5340 Unspecified visual field defects: Secondary | ICD-10-CM | POA: Insufficient documentation

## 2011-08-28 DIAGNOSIS — M2420 Disorder of ligament, unspecified site: Secondary | ICD-10-CM | POA: Insufficient documentation

## 2011-08-31 DIAGNOSIS — H02409 Unspecified ptosis of unspecified eyelid: Secondary | ICD-10-CM | POA: Diagnosis not present

## 2011-08-31 DIAGNOSIS — H02839 Dermatochalasis of unspecified eye, unspecified eyelid: Secondary | ICD-10-CM | POA: Diagnosis not present

## 2011-08-31 DIAGNOSIS — Z7982 Long term (current) use of aspirin: Secondary | ICD-10-CM | POA: Diagnosis not present

## 2011-08-31 DIAGNOSIS — E079 Disorder of thyroid, unspecified: Secondary | ICD-10-CM | POA: Diagnosis not present

## 2011-08-31 DIAGNOSIS — H539 Unspecified visual disturbance: Secondary | ICD-10-CM | POA: Diagnosis not present

## 2011-08-31 DIAGNOSIS — I10 Essential (primary) hypertension: Secondary | ICD-10-CM | POA: Diagnosis not present

## 2011-08-31 DIAGNOSIS — H02429 Myogenic ptosis of unspecified eyelid: Secondary | ICD-10-CM | POA: Diagnosis not present

## 2011-08-31 DIAGNOSIS — H5340 Unspecified visual field defects: Secondary | ICD-10-CM | POA: Diagnosis not present

## 2011-08-31 DIAGNOSIS — H04129 Dry eye syndrome of unspecified lacrimal gland: Secondary | ICD-10-CM | POA: Diagnosis not present

## 2011-11-30 DIAGNOSIS — Z23 Encounter for immunization: Secondary | ICD-10-CM | POA: Diagnosis not present

## 2012-04-07 DIAGNOSIS — I781 Nevus, non-neoplastic: Secondary | ICD-10-CM | POA: Diagnosis not present

## 2012-07-01 DIAGNOSIS — G4710 Hypersomnia, unspecified: Secondary | ICD-10-CM | POA: Diagnosis not present

## 2012-07-01 DIAGNOSIS — I10 Essential (primary) hypertension: Secondary | ICD-10-CM | POA: Diagnosis not present

## 2012-07-01 DIAGNOSIS — F4320 Adjustment disorder, unspecified: Secondary | ICD-10-CM | POA: Diagnosis not present

## 2012-07-01 DIAGNOSIS — E039 Hypothyroidism, unspecified: Secondary | ICD-10-CM | POA: Diagnosis not present

## 2012-07-01 DIAGNOSIS — N390 Urinary tract infection, site not specified: Secondary | ICD-10-CM | POA: Diagnosis not present

## 2012-07-01 DIAGNOSIS — R609 Edema, unspecified: Secondary | ICD-10-CM | POA: Diagnosis not present

## 2012-07-01 DIAGNOSIS — E669 Obesity, unspecified: Secondary | ICD-10-CM | POA: Diagnosis not present

## 2012-07-01 DIAGNOSIS — E782 Mixed hyperlipidemia: Secondary | ICD-10-CM | POA: Diagnosis not present

## 2012-07-01 DIAGNOSIS — Z79899 Other long term (current) drug therapy: Secondary | ICD-10-CM | POA: Diagnosis not present

## 2012-08-08 DIAGNOSIS — R609 Edema, unspecified: Secondary | ICD-10-CM | POA: Diagnosis not present

## 2012-08-08 DIAGNOSIS — F4320 Adjustment disorder, unspecified: Secondary | ICD-10-CM | POA: Diagnosis not present

## 2012-08-08 DIAGNOSIS — E669 Obesity, unspecified: Secondary | ICD-10-CM | POA: Diagnosis not present

## 2012-08-08 DIAGNOSIS — E782 Mixed hyperlipidemia: Secondary | ICD-10-CM | POA: Diagnosis not present

## 2012-08-08 DIAGNOSIS — E039 Hypothyroidism, unspecified: Secondary | ICD-10-CM | POA: Diagnosis not present

## 2012-08-08 DIAGNOSIS — I10 Essential (primary) hypertension: Secondary | ICD-10-CM | POA: Diagnosis not present

## 2012-08-08 DIAGNOSIS — R7989 Other specified abnormal findings of blood chemistry: Secondary | ICD-10-CM | POA: Diagnosis not present

## 2012-08-08 DIAGNOSIS — G4710 Hypersomnia, unspecified: Secondary | ICD-10-CM | POA: Diagnosis not present

## 2012-09-12 DIAGNOSIS — R05 Cough: Secondary | ICD-10-CM | POA: Diagnosis not present

## 2012-09-12 DIAGNOSIS — K219 Gastro-esophageal reflux disease without esophagitis: Secondary | ICD-10-CM | POA: Diagnosis not present

## 2012-09-26 DIAGNOSIS — Z79899 Other long term (current) drug therapy: Secondary | ICD-10-CM | POA: Diagnosis not present

## 2012-10-22 DIAGNOSIS — R609 Edema, unspecified: Secondary | ICD-10-CM | POA: Diagnosis not present

## 2012-10-22 DIAGNOSIS — I10 Essential (primary) hypertension: Secondary | ICD-10-CM | POA: Diagnosis not present

## 2012-10-22 DIAGNOSIS — E559 Vitamin D deficiency, unspecified: Secondary | ICD-10-CM | POA: Diagnosis not present

## 2012-10-22 DIAGNOSIS — F4320 Adjustment disorder, unspecified: Secondary | ICD-10-CM | POA: Diagnosis not present

## 2012-10-22 DIAGNOSIS — E782 Mixed hyperlipidemia: Secondary | ICD-10-CM | POA: Diagnosis not present

## 2012-10-22 DIAGNOSIS — E669 Obesity, unspecified: Secondary | ICD-10-CM | POA: Diagnosis not present

## 2012-10-22 DIAGNOSIS — R7989 Other specified abnormal findings of blood chemistry: Secondary | ICD-10-CM | POA: Diagnosis not present

## 2012-10-22 DIAGNOSIS — E039 Hypothyroidism, unspecified: Secondary | ICD-10-CM | POA: Diagnosis not present

## 2012-10-28 DIAGNOSIS — R7989 Other specified abnormal findings of blood chemistry: Secondary | ICD-10-CM | POA: Diagnosis not present

## 2012-11-25 DIAGNOSIS — R7989 Other specified abnormal findings of blood chemistry: Secondary | ICD-10-CM | POA: Diagnosis not present

## 2012-11-25 DIAGNOSIS — F4320 Adjustment disorder, unspecified: Secondary | ICD-10-CM | POA: Diagnosis not present

## 2012-11-25 DIAGNOSIS — R609 Edema, unspecified: Secondary | ICD-10-CM | POA: Diagnosis not present

## 2012-11-25 DIAGNOSIS — E559 Vitamin D deficiency, unspecified: Secondary | ICD-10-CM | POA: Diagnosis not present

## 2012-11-25 DIAGNOSIS — G4710 Hypersomnia, unspecified: Secondary | ICD-10-CM | POA: Diagnosis not present

## 2012-11-25 DIAGNOSIS — E039 Hypothyroidism, unspecified: Secondary | ICD-10-CM | POA: Diagnosis not present

## 2012-11-25 DIAGNOSIS — I10 Essential (primary) hypertension: Secondary | ICD-10-CM | POA: Diagnosis not present

## 2012-11-25 DIAGNOSIS — E669 Obesity, unspecified: Secondary | ICD-10-CM | POA: Diagnosis not present

## 2012-11-25 DIAGNOSIS — E782 Mixed hyperlipidemia: Secondary | ICD-10-CM | POA: Diagnosis not present

## 2013-05-18 DIAGNOSIS — D1801 Hemangioma of skin and subcutaneous tissue: Secondary | ICD-10-CM | POA: Diagnosis not present

## 2013-05-18 DIAGNOSIS — D235 Other benign neoplasm of skin of trunk: Secondary | ICD-10-CM | POA: Diagnosis not present

## 2013-06-17 DIAGNOSIS — H524 Presbyopia: Secondary | ICD-10-CM | POA: Diagnosis not present

## 2013-06-17 DIAGNOSIS — H16229 Keratoconjunctivitis sicca, not specified as Sjogren's, unspecified eye: Secondary | ICD-10-CM | POA: Diagnosis not present

## 2013-06-17 DIAGNOSIS — H2510 Age-related nuclear cataract, unspecified eye: Secondary | ICD-10-CM | POA: Diagnosis not present

## 2013-07-29 DIAGNOSIS — R209 Unspecified disturbances of skin sensation: Secondary | ICD-10-CM | POA: Diagnosis not present

## 2013-07-29 DIAGNOSIS — N390 Urinary tract infection, site not specified: Secondary | ICD-10-CM | POA: Diagnosis not present

## 2013-07-29 DIAGNOSIS — R198 Other specified symptoms and signs involving the digestive system and abdomen: Secondary | ICD-10-CM | POA: Diagnosis not present

## 2013-08-24 ENCOUNTER — Ambulatory Visit (INDEPENDENT_AMBULATORY_CARE_PROVIDER_SITE_OTHER): Payer: MEDICARE | Admitting: General Surgery

## 2013-09-15 ENCOUNTER — Ambulatory Visit (INDEPENDENT_AMBULATORY_CARE_PROVIDER_SITE_OTHER): Payer: MEDICARE | Admitting: Surgery

## 2013-09-22 ENCOUNTER — Ambulatory Visit (INDEPENDENT_AMBULATORY_CARE_PROVIDER_SITE_OTHER): Payer: MEDICARE

## 2013-09-22 ENCOUNTER — Encounter: Payer: Self-pay | Admitting: Podiatry

## 2013-09-22 ENCOUNTER — Other Ambulatory Visit: Payer: Self-pay | Admitting: *Deleted

## 2013-09-22 ENCOUNTER — Ambulatory Visit (INDEPENDENT_AMBULATORY_CARE_PROVIDER_SITE_OTHER): Payer: MEDICARE | Admitting: Podiatry

## 2013-09-22 VITALS — BP 137/86 | HR 64 | Resp 16 | Ht 63.5 in | Wt 183.0 lb

## 2013-09-22 DIAGNOSIS — M2010 Hallux valgus (acquired), unspecified foot: Secondary | ICD-10-CM

## 2013-09-22 DIAGNOSIS — M2042 Other hammer toe(s) (acquired), left foot: Secondary | ICD-10-CM

## 2013-09-22 DIAGNOSIS — M2040 Other hammer toe(s) (acquired), unspecified foot: Secondary | ICD-10-CM

## 2013-09-22 DIAGNOSIS — M216X9 Other acquired deformities of unspecified foot: Secondary | ICD-10-CM

## 2013-09-22 NOTE — Progress Notes (Signed)
   Subjective:    Patient ID: Toni Jacobs, female    DOB: 12/06/1940, 73 y.o.   MRN: [id number]  HPI Comments: This toe is lazy it is leaning on the big toe , left #2 toe is painful. The toe has been bothering me for 15 years now     Review of Systems  All other systems reviewed and are negative.      Objective:   Physical Exam: I have reviewed her past medical history medications allergies surgeries social history and review of systems. Pulses are strongly palpable bilateral. Neurologic sensorium is intact per since once the monofilament. Deep tendon reflexes are intact bilateral muscle strength is 5 over 5 dorsiflexors plantar flexors inverters everters all his musculature appears to be intact. Orthopedic evaluation demonstrates mild hallux abductovalgus deformity. Mild adductus of the second and third digits toward the hallux. 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 This is confirmed by radiographic evaluation demonstrated mild hallux valgus deformity and very long second and third metatarsals with mild hammertoe deformities. Cutaneous evaluation demonstrates supple well hydrated cutis no erythema edema cellulitis drainage or odor.        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 Plantar flexed elongated second metatarsal and third metatarsal.  Plan: Discussed etiology pathology conservative versus surgical therapies. We considered her today for an [facility] bunion repair with screw fixation. Second and third metatarsal osteotomies with screw fixation. Hammertoe repair #2 and #3 with pin fixation. I answered all the questions regarding his procedures to the best of my ability in layman terms. She understood this was amenable to it and signed all 3. pages of the consent form. We did  discuss in great detail the possible complications which may include but are not limited to postop pain bleeding swelling infection need for further surgery also digit also limb loss of life. I will followup with her in the near future for surgery.

## 2013-10-03 ENCOUNTER — Encounter: Payer: Self-pay | Admitting: *Deleted

## 2013-10-08 ENCOUNTER — Telehealth: Payer: Self-pay | Admitting: *Deleted

## 2013-10-08 NOTE — Telephone Encounter (Signed)
I have surgery scheduled on 11/06/2013 and I'd like to change it to 11/27/2013 if possible.  Call me back.

## 2013-10-08 NOTE — Telephone Encounter (Signed)
I had come in to see Dr. Milinda Pointer.  My surgery is scheduled for 11/06/2013.  I had called earlier to change it.  My husband told me to leave it for 11/06/2013.  Call me back and let me know that you didn't change it.  My husband said I would just try to back out and that's what I was trying to do.  Please call me back.  Thank you Toni Jacobs.

## 2013-10-08 NOTE — Telephone Encounter (Signed)
I returned her call and informed her that I got her message and we are leaving everything scheduled as is.  Nothing has been changed.  She stated thank you, sorry to put you through any trouble.  I told her it was no trouble at all.  She stated I have a reunion coming up on 11/21/2013 but I will just wear my boot or not go at all.  Thanks for calling me back.

## 2013-11-05 ENCOUNTER — Other Ambulatory Visit: Payer: Self-pay | Admitting: Podiatry

## 2013-11-05 MED ORDER — OXYCODONE-ACETAMINOPHEN 10-325 MG PO TABS: ORAL_TABLET | ORAL | Status: DC

## 2013-11-05 MED ORDER — PROMETHAZINE HCL 25 MG PO TABS: 25.0000 mg | ORAL_TABLET | Freq: Three times a day (TID) | ORAL | Status: DC | PRN

## 2013-11-05 MED ORDER — CEPHALEXIN 500 MG PO CAPS: 500.0000 mg | ORAL_CAPSULE | Freq: Three times a day (TID) | ORAL | Status: DC

## 2013-11-06 ENCOUNTER — Encounter: Payer: Self-pay | Admitting: Podiatry

## 2013-11-06 DIAGNOSIS — M21549 Acquired clubfoot, unspecified foot: Secondary | ICD-10-CM | POA: Diagnosis not present

## 2013-11-06 DIAGNOSIS — G4733 Obstructive sleep apnea (adult) (pediatric): Secondary | ICD-10-CM | POA: Diagnosis not present

## 2013-11-06 DIAGNOSIS — M2040 Other hammer toe(s) (acquired), unspecified foot: Secondary | ICD-10-CM | POA: Diagnosis not present

## 2013-11-06 DIAGNOSIS — M2010 Hallux valgus (acquired), unspecified foot: Secondary | ICD-10-CM | POA: Diagnosis not present

## 2013-11-06 DIAGNOSIS — M216X9 Other acquired deformities of unspecified foot: Secondary | ICD-10-CM | POA: Diagnosis not present

## 2013-11-06 DIAGNOSIS — M21619 Bunion of unspecified foot: Secondary | ICD-10-CM | POA: Diagnosis not present

## 2013-11-06 DIAGNOSIS — M25579 Pain in unspecified ankle and joints of unspecified foot: Secondary | ICD-10-CM | POA: Diagnosis not present

## 2013-11-12 ENCOUNTER — Encounter: Payer: Self-pay | Admitting: Podiatry

## 2013-11-12 ENCOUNTER — Ambulatory Visit (INDEPENDENT_AMBULATORY_CARE_PROVIDER_SITE_OTHER): Payer: MEDICARE

## 2013-11-12 ENCOUNTER — Ambulatory Visit (INDEPENDENT_AMBULATORY_CARE_PROVIDER_SITE_OTHER): Payer: MEDICARE | Admitting: Podiatry

## 2013-11-12 VITALS — BP 158/69 | HR 86 | Resp 12

## 2013-11-12 DIAGNOSIS — M2040 Other hammer toe(s) (acquired), unspecified foot: Secondary | ICD-10-CM

## 2013-11-12 DIAGNOSIS — M2042 Other hammer toe(s) (acquired), left foot: Secondary | ICD-10-CM

## 2013-11-12 DIAGNOSIS — Z9889 Other specified postprocedural states: Secondary | ICD-10-CM

## 2013-11-12 DIAGNOSIS — M2010 Hallux valgus (acquired), unspecified foot: Secondary | ICD-10-CM

## 2013-11-13 NOTE — Progress Notes (Signed)
She presents now one week status post [facility] bunion repair second and third metatarsal osteotomies with screws and hammertoe repairs with pins #2 #3 on the left foot. She states it is been a little bit sore and painful otherwise she is done well. She denies fever chills nausea vomiting muscle aches and pains.  Objective: Vital signs are stable she is alert and oriented x3. Pulses are strongly palpable dry sterile dressing was intact once removed demonstrates rectus toes #2 and 3 left rectus bunion deformity as directed. Radiographic evaluation demonstrates well-healing osteotomies. And good pin placement.  Assessment: Well-healing surgical foot left.  Plan: Redressed today with a dry sterile compressive dressing. She's to continue the use of the Cam Walker. She will followup with Dr. Earleen Newport in one week for a dressing change and more than likely we will continue the use of the Cam Walker.

## 2013-11-17 NOTE — Progress Notes (Signed)
Dr Milinda Pointer performed a left [place] bunionectomy and 2,3 left met osteotomy and left 2,3 hammertoe repair with pins

## 2013-11-18 ENCOUNTER — Encounter: Payer: Self-pay | Admitting: Podiatry

## 2013-11-18 ENCOUNTER — Ambulatory Visit (INDEPENDENT_AMBULATORY_CARE_PROVIDER_SITE_OTHER): Payer: MEDICARE | Admitting: Podiatry

## 2013-11-18 VITALS — BP 152/80 | HR 68 | Resp 15

## 2013-11-18 DIAGNOSIS — Z9889 Other specified postprocedural states: Secondary | ICD-10-CM

## 2013-11-18 NOTE — Progress Notes (Signed)
   Subjective:    Patient ID: Toni Jacobs, female    DOB: 23-Jul-1940, 73 y.o.   MRN: [id number]  HPI Comments: DOS 11/06/2013 left austin bunionectomy, 2,3 metatarsal osteotomies, 2,3 hammer toe repairs.  Pt states she has decreased the Oxycodone and is using Advil for pain. She continues wearing the cam boot although she does state that she does take it off at times. She has walked for short distances without the cam boot. Denies any injury to the area. Denies any systemic complaints as fevers, chills, nausea, vomiting. No other complaints at this time.   Review of Systems  All other systems reviewed and are negative.      Objective:   Physical Exam AAO x3, NAD DP/PT pulses palpable 2/4 b/l. CRT < 3 sec Protective sensation intact with Simms Weinstein monofilament. Incisions over the bunion, second and third hammertoe deformities well coapted without any evidence of dehiscence. No surrounding erythema, drainage, malodor, ascending cellulitis. No increased warmth to the area. Mild tenderness over surgical site. Mild edema although appears to have improved or there are skin lines present. No calf pain, swelling, warmth.    Assessment & Plan:  73 year old female status post left foot Austin bunionectomy, second and third metatarsal osteotomies with hammertoe repair. -Treatment options were discussed including alternatives, risks, complications. -Suture knots were cut today his incision is well coapted. Steri-Strips are applied over for reinforcement. -Dry sterile dressing applied. Antibiotic ointment applied over the pin sites. He continued antibiotic ointment over the pin sites. -Discussed at length with the patient the importance of wearing the cam boot at all times and that without the boot she can injure the surgical site easily. -Followup in 2 weeks with Dr. Milinda Pointer or sooner any problems are to arise or any changes symptoms. In the meantime call any questions, concerns.

## 2013-11-18 NOTE — Patient Instructions (Signed)
Monitor for any signs/symptoms of infection. Call the office immediately if any occur or go directly to the emergency room. Call with any questions/concerns.  

## 2013-12-02 DIAGNOSIS — Z23 Encounter for immunization: Secondary | ICD-10-CM | POA: Diagnosis not present

## 2013-12-03 ENCOUNTER — Encounter: Payer: Self-pay | Admitting: Podiatry

## 2013-12-03 ENCOUNTER — Ambulatory Visit (INDEPENDENT_AMBULATORY_CARE_PROVIDER_SITE_OTHER): Payer: MEDICARE | Admitting: Podiatry

## 2013-12-03 ENCOUNTER — Ambulatory Visit (INDEPENDENT_AMBULATORY_CARE_PROVIDER_SITE_OTHER): Payer: MEDICARE

## 2013-12-03 VITALS — BP 142/89 | HR 72 | Resp 16

## 2013-12-03 DIAGNOSIS — Z9889 Other specified postprocedural states: Secondary | ICD-10-CM

## 2013-12-03 DIAGNOSIS — M2042 Other hammer toe(s) (acquired), left foot: Secondary | ICD-10-CM

## 2013-12-03 DIAGNOSIS — M2012 Hallux valgus (acquired), left foot: Secondary | ICD-10-CM

## 2013-12-03 DIAGNOSIS — M21612 Bunion of left foot: Secondary | ICD-10-CM

## 2013-12-04 NOTE — Progress Notes (Signed)
She presents today for followup of her Liane Comber bunion repair left second and third metatarsal osteotomy with hammertoe repair #2 and #3 of her left foot. She retains K wires to toes #2 #3. Date of surgery 11/06/2013. She denies fever chills nausea vomiting muscle aches and pains.  Objective: Vital signs are stable she is alert and oriented x3. Minimal edema no erythema cellulitis drainage or odor she has good range of motion of the first metatarsophalangeal joint K wires retained do not appear to be retrograding. There is no sign of infection. Radiographically evaluation does demonstrate that the osteotomies are healing.  Assessment: Well-healing surgical foot status post Austin bunion repair with screw left second and third metatarsal osteotomies left hammertoe repair #2 and #3 of the left foot. Date of surgery 11/06/2013.  Plan: Discussed etiology pathology conservative versus surgical therapies. At this point we are going to leave the K wires in for 2 more weeks I would allow her to start getting this wet as long she will soak in Epsom salts and water. She will keep this elevated and stay off of it as much as possible he'll followup with her in 2 weeks at which time K wires will be removed. Radiographs will be taken at that time.

## 2013-12-17 ENCOUNTER — Ambulatory Visit (INDEPENDENT_AMBULATORY_CARE_PROVIDER_SITE_OTHER): Payer: MEDICARE | Admitting: Podiatry

## 2013-12-17 ENCOUNTER — Encounter: Payer: Self-pay | Admitting: Podiatry

## 2013-12-17 ENCOUNTER — Ambulatory Visit (INDEPENDENT_AMBULATORY_CARE_PROVIDER_SITE_OTHER): Payer: MEDICARE

## 2013-12-17 DIAGNOSIS — M2012 Hallux valgus (acquired), left foot: Secondary | ICD-10-CM

## 2013-12-17 DIAGNOSIS — Z9889 Other specified postprocedural states: Secondary | ICD-10-CM

## 2013-12-17 DIAGNOSIS — M21612 Bunion of left foot: Secondary | ICD-10-CM

## 2013-12-17 NOTE — Progress Notes (Signed)
She presents today for follow-up of her surgical foot. She denies fever chills nausea vomiting muscle aches and pains. Status post hammertoe repair second third toes with pins.  Objective: Vital signs are stable alert and oriented 3. There is no erythema mild edema no cellulitis drainage or odor. Pins to the toes seems to be intact margins are well coapted sutures were removed today.  Assessment: Well-healed surgical foot left.  Plan: Dispensed an anklet today and a Darco shoe. I will follow up with her in 2 weeks for another set of x-rays.

## 2013-12-18 DIAGNOSIS — H16223 Keratoconjunctivitis sicca, not specified as Sjogren's, bilateral: Secondary | ICD-10-CM | POA: Diagnosis not present

## 2013-12-18 DIAGNOSIS — H16213 Exposure keratoconjunctivitis, bilateral: Secondary | ICD-10-CM | POA: Diagnosis not present

## 2013-12-31 ENCOUNTER — Encounter: Payer: Self-pay | Admitting: Podiatry

## 2013-12-31 ENCOUNTER — Ambulatory Visit (INDEPENDENT_AMBULATORY_CARE_PROVIDER_SITE_OTHER): Payer: MEDICARE | Admitting: Podiatry

## 2013-12-31 ENCOUNTER — Ambulatory Visit (INDEPENDENT_AMBULATORY_CARE_PROVIDER_SITE_OTHER): Payer: MEDICARE

## 2013-12-31 VITALS — BP 144/75 | HR 68 | Resp 14

## 2013-12-31 DIAGNOSIS — M2012 Hallux valgus (acquired), left foot: Secondary | ICD-10-CM

## 2013-12-31 DIAGNOSIS — M21612 Bunion of left foot: Secondary | ICD-10-CM

## 2013-12-31 DIAGNOSIS — Z9889 Other specified postprocedural states: Secondary | ICD-10-CM

## 2013-12-31 NOTE — Progress Notes (Signed)
   Subjective:    Patient ID: Toni Jacobs, female    DOB: January 20, 1941, 73 y.o.   MRN: [id number]  HPI Comments: DOS 11/06/2013 Left austin bunionectomy, 2,3 metatarsal osteotomy, 2,3 hammer toe repair.     Review of Systems     Objective:   Physical Examshe presents todayfor follow-up of her second and third metatarsal osteotomies with hammertoe repair second and third left. She states they appear to be doing quite well other than the swelling.        Assessment & Plan:  Assessment well-healing surgical foot left.status post [facility] bunion repair left second and third metatarsal osteotomies with hammertoe repair #2 and #3 of the left foot.  Plan: Encourage range of motion exercisesand compression wraps to the toes. I will allow her to get back into her regular shoe. Follow up with her in a few weeks. Remember to thank them for my [facility].

## 2014-02-16 ENCOUNTER — Ambulatory Visit (INDEPENDENT_AMBULATORY_CARE_PROVIDER_SITE_OTHER): Payer: MEDICARE | Admitting: Podiatry

## 2014-02-16 ENCOUNTER — Encounter: Payer: Self-pay | Admitting: Podiatry

## 2014-02-16 ENCOUNTER — Ambulatory Visit (INDEPENDENT_AMBULATORY_CARE_PROVIDER_SITE_OTHER): Payer: MEDICARE

## 2014-02-16 VITALS — BP 112/69 | HR 73 | Resp 16

## 2014-02-16 DIAGNOSIS — M2042 Other hammer toe(s) (acquired), left foot: Secondary | ICD-10-CM

## 2014-02-16 DIAGNOSIS — Z9889 Other specified postprocedural states: Secondary | ICD-10-CM

## 2014-02-17 NOTE — Progress Notes (Signed)
Toni Jacobs presents today status post Austin bunion repair second metatarsal osteotomy and hammertoe repair #2 and #3 of the left foot. She states that it is still sore on occasion but is not an everyday event.  Objective: Vital signs are stable she is alert and oriented 3. There is no erythema edema cellulitis drainage or odor. She has good range of motion of the toes. Radiographic evaluation demonstrates well-healed surgical sites left foot.  Assessment: Well-healed surgical foot status post Austin bunion repair second metatarsal osteotomy and hammertoe repair #2 #3 left foot on 11/06/2013.  Plan: I will follow-up with her as needed.

## 2014-02-24 DIAGNOSIS — R799 Abnormal finding of blood chemistry, unspecified: Secondary | ICD-10-CM | POA: Diagnosis not present

## 2014-02-24 DIAGNOSIS — E782 Mixed hyperlipidemia: Secondary | ICD-10-CM | POA: Diagnosis not present

## 2014-02-24 DIAGNOSIS — Z86010 Personal history of colonic polyps: Secondary | ICD-10-CM | POA: Diagnosis not present

## 2014-02-24 DIAGNOSIS — I10 Essential (primary) hypertension: Secondary | ICD-10-CM | POA: Diagnosis not present

## 2014-02-24 DIAGNOSIS — E559 Vitamin D deficiency, unspecified: Secondary | ICD-10-CM | POA: Diagnosis not present

## 2014-02-24 DIAGNOSIS — F4320 Adjustment disorder, unspecified: Secondary | ICD-10-CM | POA: Diagnosis not present

## 2014-02-24 DIAGNOSIS — Z79899 Other long term (current) drug therapy: Secondary | ICD-10-CM | POA: Diagnosis not present

## 2014-02-24 DIAGNOSIS — E039 Hypothyroidism, unspecified: Secondary | ICD-10-CM | POA: Diagnosis not present

## 2014-02-24 DIAGNOSIS — K5730 Diverticulosis of large intestine without perforation or abscess without bleeding: Secondary | ICD-10-CM | POA: Diagnosis not present

## 2014-03-16 DIAGNOSIS — Z1231 Encounter for screening mammogram for malignant neoplasm of breast: Secondary | ICD-10-CM | POA: Diagnosis not present

## 2014-03-30 DIAGNOSIS — Z01419 Encounter for gynecological examination (general) (routine) without abnormal findings: Secondary | ICD-10-CM | POA: Diagnosis not present

## 2014-03-30 DIAGNOSIS — K644 Residual hemorrhoidal skin tags: Secondary | ICD-10-CM | POA: Diagnosis not present

## 2014-03-30 DIAGNOSIS — R300 Dysuria: Secondary | ICD-10-CM | POA: Diagnosis not present

## 2014-03-31 DIAGNOSIS — M7551 Bursitis of right shoulder: Secondary | ICD-10-CM | POA: Diagnosis not present

## 2014-04-22 DIAGNOSIS — M7521 Bicipital tendinitis, right shoulder: Secondary | ICD-10-CM | POA: Diagnosis not present

## 2014-06-16 DIAGNOSIS — H5201 Hypermetropia, right eye: Secondary | ICD-10-CM | POA: Diagnosis not present

## 2014-06-16 DIAGNOSIS — H2513 Age-related nuclear cataract, bilateral: Secondary | ICD-10-CM | POA: Diagnosis not present

## 2014-06-16 DIAGNOSIS — H16213 Exposure keratoconjunctivitis, bilateral: Secondary | ICD-10-CM | POA: Diagnosis not present

## 2014-06-16 DIAGNOSIS — H5202 Hypermetropia, left eye: Secondary | ICD-10-CM | POA: Diagnosis not present

## 2014-06-16 DIAGNOSIS — H16223 Keratoconjunctivitis sicca, not specified as Sjogren's, bilateral: Secondary | ICD-10-CM | POA: Diagnosis not present

## 2014-08-16 ENCOUNTER — Other Ambulatory Visit: Payer: Self-pay

## 2014-09-10 DIAGNOSIS — E039 Hypothyroidism, unspecified: Secondary | ICD-10-CM | POA: Diagnosis not present

## 2014-09-10 DIAGNOSIS — Z79899 Other long term (current) drug therapy: Secondary | ICD-10-CM | POA: Diagnosis not present

## 2014-09-10 DIAGNOSIS — I10 Essential (primary) hypertension: Secondary | ICD-10-CM | POA: Diagnosis not present

## 2014-09-10 DIAGNOSIS — Z23 Encounter for immunization: Secondary | ICD-10-CM | POA: Diagnosis not present

## 2014-09-10 DIAGNOSIS — Z0001 Encounter for general adult medical examination with abnormal findings: Secondary | ICD-10-CM | POA: Diagnosis not present

## 2014-09-10 DIAGNOSIS — R799 Abnormal finding of blood chemistry, unspecified: Secondary | ICD-10-CM | POA: Diagnosis not present

## 2014-09-10 DIAGNOSIS — E559 Vitamin D deficiency, unspecified: Secondary | ICD-10-CM | POA: Diagnosis not present

## 2014-09-10 DIAGNOSIS — F4320 Adjustment disorder, unspecified: Secondary | ICD-10-CM | POA: Diagnosis not present

## 2014-09-10 DIAGNOSIS — K5730 Diverticulosis of large intestine without perforation or abscess without bleeding: Secondary | ICD-10-CM | POA: Diagnosis not present

## 2014-09-10 DIAGNOSIS — E782 Mixed hyperlipidemia: Secondary | ICD-10-CM | POA: Diagnosis not present

## 2014-09-10 DIAGNOSIS — Z86010 Personal history of colonic polyps: Secondary | ICD-10-CM | POA: Diagnosis not present

## 2014-09-27 DIAGNOSIS — Z1212 Encounter for screening for malignant neoplasm of rectum: Secondary | ICD-10-CM | POA: Diagnosis not present

## 2014-09-27 DIAGNOSIS — Z1211 Encounter for screening for malignant neoplasm of colon: Secondary | ICD-10-CM | POA: Diagnosis not present

## 2014-09-27 LAB — COLOGUARD

## 2014-11-05 DIAGNOSIS — R195 Other fecal abnormalities: Secondary | ICD-10-CM | POA: Diagnosis not present

## 2014-11-05 DIAGNOSIS — K5900 Constipation, unspecified: Secondary | ICD-10-CM | POA: Diagnosis not present

## 2014-11-05 DIAGNOSIS — R109 Unspecified abdominal pain: Secondary | ICD-10-CM | POA: Diagnosis not present

## 2014-12-08 DIAGNOSIS — Z23 Encounter for immunization: Secondary | ICD-10-CM | POA: Diagnosis not present

## 2014-12-13 DIAGNOSIS — D126 Benign neoplasm of colon, unspecified: Secondary | ICD-10-CM | POA: Diagnosis not present

## 2014-12-13 DIAGNOSIS — K5730 Diverticulosis of large intestine without perforation or abscess without bleeding: Secondary | ICD-10-CM | POA: Diagnosis not present

## 2014-12-13 DIAGNOSIS — R195 Other fecal abnormalities: Secondary | ICD-10-CM | POA: Diagnosis not present

## 2014-12-13 DIAGNOSIS — D123 Benign neoplasm of transverse colon: Secondary | ICD-10-CM | POA: Diagnosis not present

## 2014-12-13 DIAGNOSIS — K644 Residual hemorrhoidal skin tags: Secondary | ICD-10-CM | POA: Diagnosis not present

## 2014-12-13 DIAGNOSIS — D122 Benign neoplasm of ascending colon: Secondary | ICD-10-CM | POA: Diagnosis not present

## 2015-06-21 DIAGNOSIS — H16223 Keratoconjunctivitis sicca, not specified as Sjogren's, bilateral: Secondary | ICD-10-CM | POA: Diagnosis not present

## 2015-06-21 DIAGNOSIS — H2513 Age-related nuclear cataract, bilateral: Secondary | ICD-10-CM | POA: Diagnosis not present

## 2015-06-21 DIAGNOSIS — H25012 Cortical age-related cataract, left eye: Secondary | ICD-10-CM | POA: Diagnosis not present

## 2015-06-21 DIAGNOSIS — H5203 Hypermetropia, bilateral: Secondary | ICD-10-CM | POA: Diagnosis not present

## 2015-07-21 DIAGNOSIS — H2589 Other age-related cataract: Secondary | ICD-10-CM | POA: Diagnosis not present

## 2015-07-21 DIAGNOSIS — H25012 Cortical age-related cataract, left eye: Secondary | ICD-10-CM | POA: Diagnosis not present

## 2015-07-21 DIAGNOSIS — H2512 Age-related nuclear cataract, left eye: Secondary | ICD-10-CM | POA: Diagnosis not present

## 2015-07-22 DIAGNOSIS — D210 Benign neoplasm of connective and other soft tissue of head, face and neck: Secondary | ICD-10-CM | POA: Diagnosis not present

## 2015-07-22 DIAGNOSIS — D485 Neoplasm of uncertain behavior of skin: Secondary | ICD-10-CM | POA: Diagnosis not present

## 2015-07-22 DIAGNOSIS — D216 Benign neoplasm of connective and other soft tissue of trunk, unspecified: Secondary | ICD-10-CM | POA: Diagnosis not present

## 2015-07-22 DIAGNOSIS — D1801 Hemangioma of skin and subcutaneous tissue: Secondary | ICD-10-CM | POA: Diagnosis not present

## 2015-07-22 DIAGNOSIS — L918 Other hypertrophic disorders of the skin: Secondary | ICD-10-CM | POA: Diagnosis not present

## 2015-09-07 DIAGNOSIS — H2511 Age-related nuclear cataract, right eye: Secondary | ICD-10-CM | POA: Diagnosis not present

## 2015-09-22 DIAGNOSIS — H2589 Other age-related cataract: Secondary | ICD-10-CM | POA: Diagnosis not present

## 2015-09-22 DIAGNOSIS — H2511 Age-related nuclear cataract, right eye: Secondary | ICD-10-CM | POA: Diagnosis not present

## 2015-10-03 DIAGNOSIS — Z0000 Encounter for general adult medical examination without abnormal findings: Secondary | ICD-10-CM | POA: Diagnosis not present

## 2015-10-03 DIAGNOSIS — Z79899 Other long term (current) drug therapy: Secondary | ICD-10-CM | POA: Diagnosis not present

## 2015-10-03 DIAGNOSIS — E782 Mixed hyperlipidemia: Secondary | ICD-10-CM | POA: Diagnosis not present

## 2015-10-03 DIAGNOSIS — E669 Obesity, unspecified: Secondary | ICD-10-CM | POA: Diagnosis not present

## 2015-10-03 DIAGNOSIS — I10 Essential (primary) hypertension: Secondary | ICD-10-CM | POA: Diagnosis not present

## 2015-10-03 DIAGNOSIS — E559 Vitamin D deficiency, unspecified: Secondary | ICD-10-CM | POA: Diagnosis not present

## 2015-10-03 DIAGNOSIS — Z6835 Body mass index (BMI) 35.0-35.9, adult: Secondary | ICD-10-CM | POA: Diagnosis not present

## 2015-10-03 DIAGNOSIS — M1990 Unspecified osteoarthritis, unspecified site: Secondary | ICD-10-CM | POA: Diagnosis not present

## 2015-10-03 DIAGNOSIS — E039 Hypothyroidism, unspecified: Secondary | ICD-10-CM | POA: Diagnosis not present

## 2015-10-03 DIAGNOSIS — R799 Abnormal finding of blood chemistry, unspecified: Secondary | ICD-10-CM | POA: Diagnosis not present

## 2015-10-03 DIAGNOSIS — K5730 Diverticulosis of large intestine without perforation or abscess without bleeding: Secondary | ICD-10-CM | POA: Diagnosis not present

## 2015-10-03 DIAGNOSIS — Z1389 Encounter for screening for other disorder: Secondary | ICD-10-CM | POA: Diagnosis not present

## 2015-10-03 DIAGNOSIS — Z13820 Encounter for screening for osteoporosis: Secondary | ICD-10-CM | POA: Diagnosis not present

## 2015-10-03 DIAGNOSIS — F4320 Adjustment disorder, unspecified: Secondary | ICD-10-CM | POA: Diagnosis not present

## 2015-10-03 DIAGNOSIS — Z86010 Personal history of colonic polyps: Secondary | ICD-10-CM | POA: Diagnosis not present

## 2015-10-05 DIAGNOSIS — E782 Mixed hyperlipidemia: Secondary | ICD-10-CM | POA: Diagnosis not present

## 2015-10-05 DIAGNOSIS — E039 Hypothyroidism, unspecified: Secondary | ICD-10-CM | POA: Diagnosis not present

## 2015-10-05 DIAGNOSIS — I10 Essential (primary) hypertension: Secondary | ICD-10-CM | POA: Diagnosis not present

## 2015-10-05 DIAGNOSIS — Z79899 Other long term (current) drug therapy: Secondary | ICD-10-CM | POA: Diagnosis not present

## 2015-10-05 DIAGNOSIS — E559 Vitamin D deficiency, unspecified: Secondary | ICD-10-CM | POA: Diagnosis not present

## 2015-11-22 DIAGNOSIS — Z23 Encounter for immunization: Secondary | ICD-10-CM | POA: Diagnosis not present

## 2015-12-09 DIAGNOSIS — F419 Anxiety disorder, unspecified: Secondary | ICD-10-CM | POA: Diagnosis not present

## 2015-12-09 DIAGNOSIS — I10 Essential (primary) hypertension: Secondary | ICD-10-CM | POA: Diagnosis not present

## 2015-12-09 DIAGNOSIS — R350 Frequency of micturition: Secondary | ICD-10-CM | POA: Diagnosis not present

## 2016-02-27 DIAGNOSIS — G4761 Periodic limb movement disorder: Secondary | ICD-10-CM | POA: Diagnosis not present

## 2016-02-27 DIAGNOSIS — K5730 Diverticulosis of large intestine without perforation or abscess without bleeding: Secondary | ICD-10-CM | POA: Diagnosis not present

## 2016-02-27 DIAGNOSIS — R599 Enlarged lymph nodes, unspecified: Secondary | ICD-10-CM | POA: Diagnosis not present

## 2016-02-27 DIAGNOSIS — I889 Nonspecific lymphadenitis, unspecified: Secondary | ICD-10-CM | POA: Diagnosis not present

## 2016-04-25 DIAGNOSIS — H26492 Other secondary cataract, left eye: Secondary | ICD-10-CM | POA: Diagnosis not present

## 2016-04-25 DIAGNOSIS — H16223 Keratoconjunctivitis sicca, not specified as Sjogren's, bilateral: Secondary | ICD-10-CM | POA: Diagnosis not present

## 2016-04-25 DIAGNOSIS — H52203 Unspecified astigmatism, bilateral: Secondary | ICD-10-CM | POA: Diagnosis not present

## 2016-04-25 DIAGNOSIS — H524 Presbyopia: Secondary | ICD-10-CM | POA: Diagnosis not present

## 2016-06-11 DIAGNOSIS — S46812A Strain of other muscles, fascia and tendons at shoulder and upper arm level, left arm, initial encounter: Secondary | ICD-10-CM | POA: Diagnosis not present

## 2016-06-11 DIAGNOSIS — I10 Essential (primary) hypertension: Secondary | ICD-10-CM | POA: Diagnosis not present

## 2016-06-14 DIAGNOSIS — M5412 Radiculopathy, cervical region: Secondary | ICD-10-CM | POA: Diagnosis not present

## 2016-06-21 DIAGNOSIS — M8588 Other specified disorders of bone density and structure, other site: Secondary | ICD-10-CM | POA: Diagnosis not present

## 2016-10-04 ENCOUNTER — Other Ambulatory Visit: Payer: Self-pay | Admitting: Internal Medicine

## 2016-10-04 DIAGNOSIS — Z23 Encounter for immunization: Secondary | ICD-10-CM | POA: Diagnosis not present

## 2016-10-04 DIAGNOSIS — E039 Hypothyroidism, unspecified: Secondary | ICD-10-CM | POA: Diagnosis not present

## 2016-10-04 DIAGNOSIS — Z1231 Encounter for screening mammogram for malignant neoplasm of breast: Secondary | ICD-10-CM

## 2016-10-04 DIAGNOSIS — F419 Anxiety disorder, unspecified: Secondary | ICD-10-CM | POA: Diagnosis not present

## 2016-10-04 DIAGNOSIS — Z79899 Other long term (current) drug therapy: Secondary | ICD-10-CM | POA: Diagnosis not present

## 2016-10-04 DIAGNOSIS — H6120 Impacted cerumen, unspecified ear: Secondary | ICD-10-CM | POA: Diagnosis not present

## 2016-10-04 DIAGNOSIS — I10 Essential (primary) hypertension: Secondary | ICD-10-CM | POA: Diagnosis not present

## 2016-10-04 DIAGNOSIS — R7989 Other specified abnormal findings of blood chemistry: Secondary | ICD-10-CM | POA: Diagnosis not present

## 2016-10-04 DIAGNOSIS — K5730 Diverticulosis of large intestine without perforation or abscess without bleeding: Secondary | ICD-10-CM | POA: Diagnosis not present

## 2016-10-04 DIAGNOSIS — E559 Vitamin D deficiency, unspecified: Secondary | ICD-10-CM | POA: Diagnosis not present

## 2016-10-04 DIAGNOSIS — Z0001 Encounter for general adult medical examination with abnormal findings: Secondary | ICD-10-CM | POA: Diagnosis not present

## 2016-10-04 DIAGNOSIS — Z1389 Encounter for screening for other disorder: Secondary | ICD-10-CM | POA: Diagnosis not present

## 2016-10-04 DIAGNOSIS — H6123 Impacted cerumen, bilateral: Secondary | ICD-10-CM | POA: Diagnosis not present

## 2016-10-24 DIAGNOSIS — H16223 Keratoconjunctivitis sicca, not specified as Sjogren's, bilateral: Secondary | ICD-10-CM | POA: Diagnosis not present

## 2016-10-24 DIAGNOSIS — H52203 Unspecified astigmatism, bilateral: Secondary | ICD-10-CM | POA: Diagnosis not present

## 2016-10-24 DIAGNOSIS — Z961 Presence of intraocular lens: Secondary | ICD-10-CM | POA: Diagnosis not present

## 2016-10-24 DIAGNOSIS — H524 Presbyopia: Secondary | ICD-10-CM | POA: Diagnosis not present

## 2016-10-24 DIAGNOSIS — H26492 Other secondary cataract, left eye: Secondary | ICD-10-CM | POA: Diagnosis not present

## 2016-10-29 ENCOUNTER — Ambulatory Visit
Admission: RE | Admit: 2016-10-29 | Discharge: 2016-10-29 | Disposition: A | Discharge status: Home / Self Care | Payer: MEDICARE | Source: Ambulatory Visit | Attending: Internal Medicine | Admitting: Internal Medicine

## 2016-10-29 DIAGNOSIS — Z1231 Encounter for screening mammogram for malignant neoplasm of breast: Secondary | ICD-10-CM

## 2016-11-06 DIAGNOSIS — H26492 Other secondary cataract, left eye: Secondary | ICD-10-CM | POA: Diagnosis not present

## 2016-12-10 DIAGNOSIS — Z23 Encounter for immunization: Secondary | ICD-10-CM | POA: Diagnosis not present

## 2016-12-20 DIAGNOSIS — D1801 Hemangioma of skin and subcutaneous tissue: Secondary | ICD-10-CM | POA: Diagnosis not present

## 2016-12-20 DIAGNOSIS — D216 Benign neoplasm of connective and other soft tissue of trunk, unspecified: Secondary | ICD-10-CM | POA: Diagnosis not present

## 2016-12-20 DIAGNOSIS — D485 Neoplasm of uncertain behavior of skin: Secondary | ICD-10-CM | POA: Diagnosis not present

## 2016-12-20 DIAGNOSIS — D3615 Benign neoplasm of peripheral nerves and autonomic nervous system of abdomen: Secondary | ICD-10-CM | POA: Diagnosis not present

## 2017-07-30 DIAGNOSIS — E039 Hypothyroidism, unspecified: Secondary | ICD-10-CM | POA: Diagnosis not present

## 2017-07-30 DIAGNOSIS — M5412 Radiculopathy, cervical region: Secondary | ICD-10-CM | POA: Diagnosis not present

## 2017-07-30 DIAGNOSIS — F419 Anxiety disorder, unspecified: Secondary | ICD-10-CM | POA: Diagnosis not present

## 2017-07-30 DIAGNOSIS — Z8249 Family history of ischemic heart disease and other diseases of the circulatory system: Secondary | ICD-10-CM | POA: Diagnosis not present

## 2017-07-30 DIAGNOSIS — Z79899 Other long term (current) drug therapy: Secondary | ICD-10-CM | POA: Diagnosis not present

## 2017-07-30 DIAGNOSIS — I10 Essential (primary) hypertension: Secondary | ICD-10-CM | POA: Diagnosis not present

## 2017-09-17 ENCOUNTER — Ambulatory Visit (INDEPENDENT_AMBULATORY_CARE_PROVIDER_SITE_OTHER): Payer: MEDICARE | Admitting: Neurology

## 2017-09-17 ENCOUNTER — Encounter: Payer: Self-pay | Admitting: Neurology

## 2017-09-17 DIAGNOSIS — G5603 Carpal tunnel syndrome, bilateral upper limbs: Secondary | ICD-10-CM

## 2017-09-17 HISTORY — DX: Carpal tunnel syndrome, bilateral upper limbs: G56.03

## 2017-09-17 NOTE — Procedures (Signed)
     HISTORY:  Toni Jacobs is a 77 year old patient with a history of numbness in the left hand that is most bothersome at nighttime.  The symptoms have been present for about 3 years but are gradually worsening.  She denies any neck pain or pain down the arm from the neck.  She is being evaluated for a possible neuropathy or a cervical radiculopathy.  NERVE CONDUCTION STUDIES:  Nerve conduction studies were performed on both upper extremities.  The distal motor latencies for the median nerves were prolonged bilaterally with borderline normal motor amplitudes for these nerves bilaterally.  The nerve conduction velocities for the median nerves were normal bilaterally.  The distal motor latency motor and amplitudes for the left ulnar nerve is normal with a normal nerve conduction velocity.  The sensory latencies for the median nerves were prolonged on the right, absent on the left, and normal for the left radial sensory latency.  The F-wave latency for the left ulnar nerve is normal.  EMG STUDIES:  EMG study was performed on the left upper extremity:  The first dorsal interosseous muscle reveals 2 to 4 K units with full recruitment. No fibrillations or positive waves were noted. The abductor pollicis brevis muscle reveals 2 to 4 K units with full recruitment. No fibrillations or positive waves were noted. The extensor indicis proprius muscle reveals 1 to 3 K units with full recruitment. No fibrillations or positive waves were noted. The pronator teres muscle reveals 2 to 3 K units with full recruitment. No fibrillations or positive waves were noted. The biceps muscle reveals 1 to 2 K units with full recruitment. No fibrillations or positive waves were noted. The triceps muscle reveals 2 to 4 K units with full recruitment. No fibrillations or positive waves were noted. The anterior deltoid muscle reveals 2 to 3 K units with full recruitment. No fibrillations or positive waves were noted. The  cervical paraspinal muscles were tested at 2 levels. No abnormalities of insertional activity were seen at either level tested. There was good relaxation.   IMPRESSION:  Nerve conduction studies done on both upper extremities shows evidence of bilateral carpal tunnel syndrome of relatively mild severity bilaterally, slightly worse on the left.  EMG evaluation of the left upper extremity is unremarkable, no evidence of an overlying cervical radiculopathy is seen.  Jill Alexanders MD 09/17/2017 11:25 AM  [facility] [address] [place], [place] [address]  Phone [phone] Fax [phone]

## 2017-09-17 NOTE — Progress Notes (Signed)
Please refer to EMG and nerve conduction study procedure note. 

## 2017-09-18 NOTE — Progress Notes (Signed)
[place]    Nerve / Sites Muscle Latency Ref. Amplitude Ref. Rel Amp Segments Distance Velocity Ref. Area    ms ms mV mV %  cm m/s m/s mVms  L Median - APB     Wrist APB 6.0 ?4.4 4.0 ?4.0 100 Wrist - APB 7   12.4     Upper arm APB 10.1  4.0  102 Upper arm - Wrist 20 49 ?49 12.7  R Median - APB     Wrist APB 4.6 ?4.4 4.2 ?4.0 100 Wrist - APB 7   13.6     Upper arm APB 8.7  4.0  96.3 Upper arm - Wrist 20 49 ?49 13.2  L Ulnar - ADM     Wrist ADM 2.7 ?3.3 7.4 ?6.0 100 Wrist - ADM 7   16.5     B.Elbow ADM 6.5  6.3  84.6 B.Elbow - Wrist 20 53 ?49 15.1     A.Elbow ADM 8.3  6.2  99.4 A.Elbow - B.Elbow 10 55 ?49 14.8         A.Elbow - Wrist               SNC    Nerve / Sites Rec. Site Peak Lat Ref.  Amp Ref. Segments Distance Ref.    ms ms V V  cm ms  L Radial - Anatomical snuff box (Forearm)     Forearm Wrist 2.6 ?2.9 12 ?15 Forearm - Wrist 10   L Median, Ulnar - Transcarpal comparison     Ulnar Palm Wrist 2.1 ?2.2 7 ?12 Ulnar Palm - Wrist 8         Median Palm - Ulnar Palm  ?0.4  L Median - Orthodromic (Dig II, Mid palm)     Dig II Wrist NR ?3.4 NR ?10 Dig II - Wrist 13   R Median - Orthodromic (Dig II, Mid palm)     Dig II Wrist 3.9 ?3.4 7 ?10 Dig II - Wrist 5                   F  Wave    Nerve F Lat Ref.   ms ms  L Ulnar - ADM 29.2 ?32.0       EMG full

## 2017-10-15 DIAGNOSIS — F419 Anxiety disorder, unspecified: Secondary | ICD-10-CM | POA: Diagnosis not present

## 2017-10-15 DIAGNOSIS — E559 Vitamin D deficiency, unspecified: Secondary | ICD-10-CM | POA: Diagnosis not present

## 2017-10-15 DIAGNOSIS — D126 Benign neoplasm of colon, unspecified: Secondary | ICD-10-CM | POA: Diagnosis not present

## 2017-10-15 DIAGNOSIS — Z8249 Family history of ischemic heart disease and other diseases of the circulatory system: Secondary | ICD-10-CM | POA: Diagnosis not present

## 2017-10-15 DIAGNOSIS — M25562 Pain in left knee: Secondary | ICD-10-CM | POA: Diagnosis not present

## 2017-10-15 DIAGNOSIS — E039 Hypothyroidism, unspecified: Secondary | ICD-10-CM | POA: Diagnosis not present

## 2017-10-15 DIAGNOSIS — Z1389 Encounter for screening for other disorder: Secondary | ICD-10-CM | POA: Diagnosis not present

## 2017-10-15 DIAGNOSIS — Z79899 Other long term (current) drug therapy: Secondary | ICD-10-CM | POA: Diagnosis not present

## 2017-10-15 DIAGNOSIS — Z0001 Encounter for general adult medical examination with abnormal findings: Secondary | ICD-10-CM | POA: Diagnosis not present

## 2017-10-15 DIAGNOSIS — E782 Mixed hyperlipidemia: Secondary | ICD-10-CM | POA: Diagnosis not present

## 2017-10-15 DIAGNOSIS — I10 Essential (primary) hypertension: Secondary | ICD-10-CM | POA: Diagnosis not present

## 2017-10-15 DIAGNOSIS — M5412 Radiculopathy, cervical region: Secondary | ICD-10-CM | POA: Diagnosis not present

## 2017-11-13 ENCOUNTER — Encounter: Payer: Self-pay | Admitting: Internal Medicine

## 2017-11-13 ENCOUNTER — Encounter: Payer: Self-pay | Admitting: Gastroenterology

## 2017-11-13 NOTE — Telephone Encounter (Signed)
ERROR

## 2017-11-19 ENCOUNTER — Encounter: Payer: Self-pay | Admitting: Interventional Cardiology

## 2017-11-19 ENCOUNTER — Encounter: Payer: Self-pay | Admitting: *Deleted

## 2017-11-19 ENCOUNTER — Ambulatory Visit (INDEPENDENT_AMBULATORY_CARE_PROVIDER_SITE_OTHER): Payer: MEDICARE | Admitting: Interventional Cardiology

## 2017-11-19 VITALS — BP 148/94 | HR 66 | Ht 63.0 in | Wt 203.6 lb

## 2017-11-19 DIAGNOSIS — E785 Hyperlipidemia, unspecified: Secondary | ICD-10-CM

## 2017-11-19 DIAGNOSIS — G4733 Obstructive sleep apnea (adult) (pediatric): Secondary | ICD-10-CM

## 2017-11-19 DIAGNOSIS — R9431 Abnormal electrocardiogram [ECG] [EKG]: Secondary | ICD-10-CM | POA: Diagnosis not present

## 2017-11-19 DIAGNOSIS — I10 Essential (primary) hypertension: Secondary | ICD-10-CM | POA: Diagnosis not present

## 2017-11-19 DIAGNOSIS — R0789 Other chest pain: Secondary | ICD-10-CM

## 2017-11-19 NOTE — Progress Notes (Signed)
Cardiology Office Note:    Date:  11/19/2017   ID:  Toni Jacobs, Toni Jacobs [birth year], MRN [id number]  PCP:  Josetta Huddle, MD  Cardiologist:  No primary care provider on file.   Referring MD: Josetta Huddle, MD   Chief Complaint  Patient presents with  . Chest Pain  . Advice Only    Family history CAD    History of Present Illness:    Toni Jacobs is a 77 y.o. female with a hx of essential hypertension, hyperlipidemia, family history of coronary disease, sleep apnea, obesity, and intermittent atypical chest pain referred by Dr. Inda Merlin for cardiac evaluation.  She has been having left arm numbness and tingling that has been fairly continuous.  She is felt to have radiculopathy involving the cervical spine.  As noted above cardiac risk factors include sleep apnea, obesity, family history, hypertension, and hyperlipidemia.  She has never been a smoker.  Past Medical History:  Diagnosis Date  . Carpal tunnel syndrome, 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  . Chest pain syndrome   . Colon polyps   . Diverticulitis   . GERD (gastroesophageal reflux disease)   . Hypercholesteremia   . Hypertension   . Hypothyroidism   . Impacted ear wax   . Obesity   . Situational stress   . Sleep apnea   . Spasm   . Thyroid disease     Past Surgical History:  Procedure Laterality Date  . ABDOMINAL HYSTERECTOMY    . APPENDECTOMY    . Unilateral Ooophorectomy      Current Medications: Current Meds  Medication Sig  . ALPRAZolam (XANAX) 0.25 MG tablet Take 0.25 mg by mouth at bedtime as needed for anxiety.  . Cholecalciferol (VITAMIN D-1000 MAX ST) 1000 UNITS tablet Take 1,000 Units by mouth 2 (two) times a week.   . ciprofloxacin (CIPRO) 500 MG tablet Take 500 mg by mouth 2 (two) times daily.  . irbesartan-hydrochlorothiazide (AVALIDE) 300-12.5 MG per tablet Take 1 tablet by mouth daily.   [name] levothyroxine (SYNTHROID, LEVOTHROID) 137 MCG tablet Take 137 mcg by mouth daily before breakfast.   .  Multiple Vitamins-Minerals (MULTIVITAMIN WITH MINERALS) tablet Take 1 tablet by mouth daily.  [name] oxyCODONE-acetaminophen (PERCOCET) 10-325 MG per tablet Take one to two tablets by mouth every six to eight hours as needed for pain.  . promethazine (PHENERGAN) 25 MG tablet Take 1 tablet (25 mg total) by mouth every 8 (eight) hours as needed for nausea or vomiting.  . tobramycin-dexamethasone (TOBRADEX) ophthalmic ointment Apply 1 application to eye daily as needed (ITCHING).   [name] topiramate (TOPAMAX) 25 MG tablet Take 25 mg by mouth 2 (two) times daily as needed (Nerve pain).      Allergies:   Tribenzor [olmesartan-amlodipine-hctz]   Social History   Socioeconomic History  . Marital status: Married    Spouse name: Not on file  . Number of children: Not on file  . Years of education: Not on file  . Highest education level: Not on file  Occupational History  . Not on file  Social Needs  . Financial resource strain: Not on file  . Food insecurity:    Worry: Not on file    Inability: Not on file  . Transportation needs:    Medical: Not on file    Non-medical: Not on file  Tobacco Use  . Smoking status: Never Smoker  . Smokeless tobacco: Never Used  Substance and Sexual Activity  . Alcohol use: Not on file  . Drug use:  Not on file  . Sexual activity: Not on file  Lifestyle  . Physical activity:    Days per week: Not on file    Minutes per session: Not on file  . Stress: Not on file  Relationships  . Social connections:    Talks on phone: Not on file    Gets together: Not on file    Attends religious service: Not on file    Active member of club or organization: Not on file    Attends meetings of clubs or organizations: Not on file    Relationship status: Not on file  Other Topics Concern  . Not on file  Social History Narrative  . Not on file     Family History: The patient's family history includes Diabetes in her brother; Heart disease in her brother, father, and  mother; Hypertension in her brother. There is no history of Breast cancer.  ROS:   Please see the history of present illness.    Snoring, leg swelling, unexplained weight gain, numbness and weakness in the arm when she awakens.  It improves if she dangles arm off the bed to allow "increase circulation".  All other systems reviewed and are negative.  EKGs/Labs/Other Studies Reviewed:    The following studies were reviewed today: No new data  EKG:  EKG is  ordered today.  The ekg ordered today demonstrates sinus rhythm with poor R wave progression, left axis deviation, and no prior tracing to compare.  Recent Labs: No results found for requested labs within last 8760 hours.  Recent Lipid Panel No results found for: CHOL, TRIG, HDL, CHOLHDL, VLDL, LDLCALC, LDLDIRECT  Physical Exam:    VS:  BP (!) 148/94   Pulse 66   Ht 5\' 3"  (1.6 m)   Wt 203 lb 9.6 oz (92.4 kg)   BMI 36.07 kg/m     Wt Readings from Last 3 Encounters:  11/19/17 203 lb 9.6 oz (92.4 kg)  09/22/13 183 lb (83 kg)     GEN: Obese and appears older than stated age, well developed in no acute distress HEENT: Normal NECK: No JVD. LYMPHATICS: No lymphadenopathy CARDIAC: RRR, no murmur, no gallop, trace to 1+ bilateral edema. VASCULAR: 2+ radial and posterior tibial bilatera no pulses. no bruits. RESPIRATORY:  Clear to auscultation without rales, wheezing or rhonchi  ABDOMEN: Soft, non-tender, non-distended, No pulsatile mass, MUSCULOSKELETAL: No deformity  SKIN: Warm and dry NEUROLOGIC:  Alert and oriented x 3 PSYCHIATRIC:  Normal affect   ASSESSMENT:    1. Other chest pain   2. Abnormal EKG   3. Essential hypertension   4. Hyperlipidemia LDL goal <70   5. Obstructive sleep apnea    PLAN:    In order of problems listed above:  1. One episode of chest discomfort that awakened her from sleep.  Poorly characterized.  Was intense in nature.  Lasted several minutes then she fell back to sleep and is unsure if  the discomfort it completely resolved.  No recurrence since that time. 2. Abnormal in appearance due to poor R wave progression and left anterior hemiblock.  No prior tracing to compare.  Likely represents her baseline tracing. 3. 130/80 mmHg is to target blood pressure.  We discussed salt restriction to 2 g or less per day. 4. LDL target should really be near 70 if possible given risk factors and age. 5. Poor compliance.  Encouraged the patient to be more dutiful with reference to her CPAP mask.  Given risk  factors including family history, hypertension, and untreated sleep apnea, a stress Myoview will be done given recent episode of chest pain to rule out significant underlying obstructive disease.   Medication Adjustments/Labs and Tests Ordered: Current medicines are reviewed at length with the patient today.  Concerns regarding medicines are outlined above.  Orders Placed This Encounter  Procedures  . MYOCARDIAL PERFUSION IMAGING  . EKG 12-Lead   No orders of the defined types were placed in this encounter.   Patient Instructions  Medication Instructions:  Your physician recommends that you continue on your current medications as directed. Please refer to the Current Medication list given to you today.  Labwork: None  Testing/Procedures: Your physician has requested that you have an exercise stress myoview. For further information please visit [email or web address]. Please follow instruction sheet, as given.   Follow-Up: Your physician recommends that you schedule a follow-up appointment as needed with Dr. Tamala Julian.   Any Other Special Instructions Will Be Listed Below (If Applicable).     If you need a refill on your cardiac medications before your next appointment, please call your pharmacy.      Signed, Sinclair Grooms, MD  11/19/2017 3:44 PM    [place]

## 2017-11-19 NOTE — Patient Instructions (Signed)
Medication Instructions:  Your physician recommends that you continue on your current medications as directed. Please refer to the Current Medication list given to you today.  Labwork: None  Testing/Procedures: Your physician has requested that you have an exercise stress myoview. For further information please visit [email or web address]. Please follow instruction sheet, as given.   Follow-Up: Your physician recommends that you schedule a follow-up appointment as needed with Dr. Tamala Julian.   Any Other Special Instructions Will Be Listed Below (If Applicable).     If you need a refill on your cardiac medications before your next appointment, please call your pharmacy.

## 2017-11-21 DIAGNOSIS — Z1231 Encounter for screening mammogram for malignant neoplasm of breast: Secondary | ICD-10-CM | POA: Diagnosis not present

## 2017-11-27 ENCOUNTER — Telehealth (HOSPITAL_COMMUNITY): Payer: Self-pay | Admitting: *Deleted

## 2017-11-27 NOTE — Telephone Encounter (Signed)
Patient given detailed instructions per Myocardial Perfusion Study Information Sheet for the test on 12/02/17. Patient notified to arrive 15 minutes early and that it is imperative to arrive on time for appointment to keep from having the test rescheduled.  If you need to cancel or reschedule your appointment, please call the office within 24 hours of your appointment. . Patient verbalized understanding.Toni Jacobs Jacqueline    

## 2017-12-02 ENCOUNTER — Ambulatory Visit (HOSPITAL_COMMUNITY): Payer: MEDICARE | Attending: Cardiovascular Disease

## 2017-12-02 DIAGNOSIS — R0789 Other chest pain: Secondary | ICD-10-CM | POA: Insufficient documentation

## 2017-12-02 DIAGNOSIS — Z23 Encounter for immunization: Secondary | ICD-10-CM | POA: Diagnosis not present

## 2017-12-02 LAB — MYOCARDIAL PERFUSION IMAGING
Estimated workload: 7 METS
Exercise duration (min): 6 min
LV dias vol: 49 mL (ref 46–106)
LV sys vol: 16 mL
MPHR: 145 {beats}/min
Peak HR: 133 {beats}/min
Percent HR: 91 %
RPE: 19
Rest HR: 62 {beats}/min
SDS: 4
SRS: 2
SSS: 6
TID: 0.65

## 2017-12-02 MED ORDER — TECHNETIUM TC 99M TETROFOSMIN IV KIT
10.2000 | PACK | Freq: Once | INTRAVENOUS | Status: AC | PRN
  Administered 2017-12-02: 10.2 via INTRAVENOUS
  Filled 2017-12-02: qty 11

## 2017-12-02 MED ORDER — TECHNETIUM TC 99M TETROFOSMIN IV KIT
30.9000 | PACK | Freq: Once | INTRAVENOUS | Status: AC | PRN
  Administered 2017-12-02: 30.9 via INTRAVENOUS
  Filled 2017-12-02: qty 31

## 2017-12-10 DIAGNOSIS — H02831 Dermatochalasis of right upper eyelid: Secondary | ICD-10-CM | POA: Diagnosis not present

## 2017-12-10 DIAGNOSIS — H02834 Dermatochalasis of left upper eyelid: Secondary | ICD-10-CM | POA: Diagnosis not present

## 2017-12-10 DIAGNOSIS — H16223 Keratoconjunctivitis sicca, not specified as Sjogren's, bilateral: Secondary | ICD-10-CM | POA: Diagnosis not present

## 2017-12-10 DIAGNOSIS — Z961 Presence of intraocular lens: Secondary | ICD-10-CM | POA: Diagnosis not present

## 2017-12-10 DIAGNOSIS — H26491 Other secondary cataract, right eye: Secondary | ICD-10-CM | POA: Diagnosis not present

## 2017-12-10 DIAGNOSIS — H43813 Vitreous degeneration, bilateral: Secondary | ICD-10-CM | POA: Diagnosis not present

## 2017-12-10 DIAGNOSIS — H5203 Hypermetropia, bilateral: Secondary | ICD-10-CM | POA: Diagnosis not present

## 2017-12-10 DIAGNOSIS — H524 Presbyopia: Secondary | ICD-10-CM | POA: Diagnosis not present

## 2017-12-10 DIAGNOSIS — H04123 Dry eye syndrome of bilateral lacrimal glands: Secondary | ICD-10-CM | POA: Diagnosis not present

## 2017-12-10 DIAGNOSIS — H52203 Unspecified astigmatism, bilateral: Secondary | ICD-10-CM | POA: Diagnosis not present

## 2017-12-26 ENCOUNTER — Encounter: Payer: Self-pay | Admitting: Internal Medicine

## 2017-12-26 ENCOUNTER — Ambulatory Visit (INDEPENDENT_AMBULATORY_CARE_PROVIDER_SITE_OTHER): Payer: MEDICARE | Admitting: Internal Medicine

## 2017-12-26 VITALS — BP 152/98 | HR 80 | Ht 63.0 in | Wt 205.0 lb

## 2017-12-26 DIAGNOSIS — K644 Residual hemorrhoidal skin tags: Secondary | ICD-10-CM | POA: Diagnosis not present

## 2017-12-26 DIAGNOSIS — K641 Second degree hemorrhoids: Secondary | ICD-10-CM | POA: Diagnosis not present

## 2017-12-26 DIAGNOSIS — Z86010 Personal history of colonic polyps: Secondary | ICD-10-CM

## 2017-12-26 DIAGNOSIS — K5909 Other constipation: Secondary | ICD-10-CM | POA: Diagnosis not present

## 2017-12-26 DIAGNOSIS — K5732 Diverticulitis of large intestine without perforation or abscess without bleeding: Secondary | ICD-10-CM

## 2017-12-26 NOTE — Progress Notes (Signed)
GENESI STEFANKO 77 y.o. [birth year] [id number]  Assessment & Plan:   Encounter Diagnoses  Name Primary?  . Chronic constipation Yes  . Hx of adenomatous colonic polyps   . Prolapsed internal hemorrhoids, grade 2   . Diverticulitis of colon without hemorrhage   . Anal skin tags   She will finish her antibiotic treatment for diverticulitis benefiber supplementation 1 tablespoon daily titrating upward daily Hi fiber diet Surveillance colonoscopy after she recovers from suspected diverticulitis The risks and benefits as well as alternatives of endoscopic procedure(s) have been discussed and reviewed. All questions answered. The patient agrees to proceed.  Consider hemorrhoidal banding pending the colonoscopy and these conservative measures  I appreciate the opportunity to care for this patient. CC: Josetta Huddle, MD     Subjective:   Chief Complaint: Fecal incontinence/leakage, incomplete defecation  HPI The patient is a pleasant elderly white woman previously followed by Dr. Wynetta Emery and then Dr. Paulita Fujita who requested a change in gastroenterologist, who has complaints of incomplete defecation generally, some smearing and leakage of stool with chronic constipation.  She has a history of adenomatous polyps on previous colonoscopy and is due for surveillance colonoscopy.  She has abdominal pain and was thought to have diverticulitis and is on Cipro and Flagyl partway through a course of treatment.  She is improving.  Recent hemoglobin TSH electrolytes are normal  Allergies  Allergen Reactions  . Tribenzor [Olmesartan-Amlodipine-Hctz]     Heart Palpitations   Current Meds  Medication Sig  . ALPRAZolam (XANAX) 0.25 MG tablet Take 0.25 mg by mouth at bedtime as needed for anxiety.  . Cholecalciferol (VITAMIN D-1000 MAX ST) 1000 UNITS tablet Take 1,000 Units by mouth 2 (two) times a week.   . ciprofloxacin (CIPRO) 500 MG tablet Take 500 mg by mouth 2 (two) times daily.  .  Cyanocobalamin (VITAMIN B-12 IJ) Inject as directed once a week.  . irbesartan-hydrochlorothiazide (AVALIDE) 300-12.5 MG per tablet Take 1 tablet by mouth daily.   [name] levothyroxine (SYNTHROID, LEVOTHROID) 137 MCG tablet Take 137 mcg by mouth daily before breakfast.   . Multiple Vitamins-Minerals (MULTIVITAMIN WITH MINERALS) tablet Take 1 tablet by mouth daily.   Past Medical History:  Diagnosis Date  . Carpal tunnel syndrome, 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  . Chest pain syndrome   . Diverticulitis   . GERD (gastroesophageal reflux disease)   . Hypercholesteremia   . Hypertension   . Hypothyroidism   . Impacted ear wax   . Obesity   . Osteoarthritis   . Situational stress   . Sleep apnea   . Spasm   . Thyroid disease   . Tubular adenoma of colon   . Vitamin D deficiency    Past Surgical History:  Procedure Laterality Date  . ABDOMINAL HYSTERECTOMY  1986  . APPENDECTOMY    . CATARACT EXTRACTION W/ INTRAOCULAR LENS IMPLANT Bilateral 07/21/15, 09/22/15  . TOE SURGERY    . Unilateral Ooophorectomy     Social History   Social History Narrative  . Not on file   family history includes Diabetes in her brother; Heart disease in her brother, father, and mother; Hypertension in her brother.   Review of Systems   Objective:   Physical Exam @BP  (!) 152/98   Pulse 80   Ht 5\' 3"  (1.6 m)   Wt 205 lb (93 kg)   BMI 36.31 kg/m @  General:  Well-developed, well-nourished and in no acute distress Eyes:  anicteric. ENT:   Mouth and posterior pharynx free  of lesions.  Neck:   supple w/o thyromegaly or mass.  Lungs: Clear to auscultation bilaterally. Heart:  S1S2, no rubs, murmurs, gallops. Abdomen:  soft, non-tender, no hepatosplenomegaly, hernia, or mass and BS+.  Rectal:  June MCMurry CMA present  Medium fleshy tags  NL rest  Decreased sqz  Poor coop sim def  Ok o/w   Anoscopy  Gr 2 hems Lymph:  no cervical or supraclavicular adenopathy. Extremities:   no edema, cyanosis or  clubbing Skin   no rash. Neuro:  A&O x 3.  Psych:  appropriate mood and  Affect.   Data Reviewed:  See HPI

## 2017-12-26 NOTE — Patient Instructions (Signed)
  We are giving you a high fiber diet handout to read over.   Please use a tablespoon of Benefiber daily.   You have been scheduled for a colonoscopy. Please follow written instructions given to you at your visit today.  Please pick up your prep supplies at the pharmacy within the next 1-3 days. If you use inhalers (even only as needed), please bring them with you on the day of your procedure. Your physician has requested that you go to www.startemmi.com and enter the access code given to you at your visit today. This web site gives a general overview about your procedure. However, you should still follow specific instructions given to you by our office regarding your preparation for the procedure.    I appreciate the opportunity to care for you. [name], MD, [facility]

## 2018-01-01 ENCOUNTER — Encounter: Payer: Self-pay | Admitting: Internal Medicine

## 2018-02-18 ENCOUNTER — Encounter: Payer: Self-pay | Admitting: Internal Medicine

## 2018-02-18 ENCOUNTER — Ambulatory Visit (AMBULATORY_SURGERY_CENTER): Payer: MEDICARE | Admitting: Internal Medicine

## 2018-02-18 VITALS — BP 146/117 | HR 79 | Temp 97.8°F | Resp 24 | Ht 63.0 in | Wt 205.0 lb

## 2018-02-18 DIAGNOSIS — K635 Polyp of colon: Secondary | ICD-10-CM

## 2018-02-18 DIAGNOSIS — D123 Benign neoplasm of transverse colon: Secondary | ICD-10-CM | POA: Diagnosis not present

## 2018-02-18 DIAGNOSIS — Z86010 Personal history of colonic polyps: Secondary | ICD-10-CM

## 2018-02-18 DIAGNOSIS — D125 Benign neoplasm of sigmoid colon: Secondary | ICD-10-CM | POA: Diagnosis not present

## 2018-02-18 DIAGNOSIS — I10 Essential (primary) hypertension: Secondary | ICD-10-CM | POA: Diagnosis not present

## 2018-02-18 DIAGNOSIS — G4733 Obstructive sleep apnea (adult) (pediatric): Secondary | ICD-10-CM | POA: Diagnosis not present

## 2018-02-18 MED ORDER — SODIUM CHLORIDE 0.9 % IV SOLN: 500.0000 mL | Freq: Once | INTRAVENOUS | Status: DC

## 2018-02-18 MED ORDER — BENEFIBER PO POWD: ORAL | 0 refills | Status: DC

## 2018-02-18 NOTE — Progress Notes (Signed)
Toni Monday CRNA made aware that pt had sips of water at 700.

## 2018-02-18 NOTE — Patient Instructions (Addendum)
I found and removed 3 tiny polyps. I saw your diverticulosis also.  No signs of cancer - we will get the polyps analyzed.  I do not plan to recommend another routine repeat colonoscopy.  If your hemorrhoids continue to bother you we could band them (call for an appointment) but please be sure to eat a high fiber diet and also take 1- 2 tablespoons of Benefiber (may purchase over the counter every day) for the next couple of months to see if that helps enough.  I appreciate the opportunity to care for you. Gatha Mayer, MD, [facility]  Information on polyps and hemorrhoids and high fiber diets given to you today.  Await pathology results.  YOU HAD AN ENDOSCOPIC PROCEDURE TODAY AT [facility]:   Refer to the procedure report that was given to you for any specific questions about what was found during the examination.  If the procedure report does not answer your questions, please call your gastroenterologist to clarify.  If you requested that your care partner not be given the details of your procedure findings, then the procedure report has been included in a sealed envelope for you to review at your convenience later.  YOU SHOULD EXPECT: Some feelings of bloating in the abdomen. Passage of more gas than usual.  Walking can help get rid of the air that was put into your GI tract during the procedure and reduce the bloating. If you had a lower endoscopy (such as a colonoscopy or flexible sigmoidoscopy) you may notice spotting of blood in your stool or on the toilet paper. If you underwent a bowel prep for your procedure, you may not have a normal bowel movement for a few days.  Please Note:  You might notice some irritation and congestion in your nose or some drainage.  This is from the oxygen used during your procedure.  There is no need for concern and it should clear up in a day or so.  SYMPTOMS TO REPORT IMMEDIATELY:   Following lower endoscopy (colonoscopy or  flexible sigmoidoscopy):  Excessive amounts of blood in the stool  Significant tenderness or worsening of abdominal pains  Swelling of the abdomen that is new, acute  Fever of 100F or higher   For urgent or emergent issues, a gastroenterologist can be reached at any hour by calling [phone].   DIET:  We do recommend a small meal at first, but then you may proceed to your regular diet.  Drink plenty of fluids but you should avoid alcoholic beverages for 24 hours.  ACTIVITY:  You should plan to take it easy for the rest of today and you should NOT DRIVE or use heavy machinery until tomorrow (because of the sedation medicines used during the test).    FOLLOW UP: Our staff will call the number listed on your records the next business day following your procedure to check on you and address any questions or concerns that you may have regarding the information given to you following your procedure. If we do not reach you, we will leave a message.  However, if you are feeling well and you are not experiencing any problems, there is no need to return our call.  We will assume that you have returned to your regular daily activities without incident.  If any biopsies were taken you will be contacted by phone or by letter within the next 1-3 weeks.  Please call us at [phone] if you have not heard  about the biopsies in 3 weeks.    SIGNATURES/CONFIDENTIALITY: You and/or your care partner have signed paperwork which will be entered into your electronic medical record.  These signatures attest to the fact that that the information above on your After Visit Summary has been reviewed and is understood.  Full responsibility of the confidentiality of this discharge information lies with you and/or your care-partner.

## 2018-02-18 NOTE — Progress Notes (Signed)
Called to room to assist during endoscopic procedure.  Patient ID and intended procedure confirmed with present staff. Received instructions for my participation in the procedure from the performing physician.  

## 2018-02-18 NOTE — Progress Notes (Signed)
Report to PACU, RN, vss, BBS= Clear.  

## 2018-02-18 NOTE — Op Note (Addendum)
[place] Patient Name: Toni Jacobs Procedure Date: 02/18/2018 8:33 AM MRN: [id number] Endoscopist: Gatha Mayer , MD Age: 77 Referring MD:  Date of Birth: March 19, 1940 Gender: Female Account #: [id number] Procedure:                Colonoscopy Indications:              Surveillance: Personal history of adenomatous                            polyps on last colonoscopy 3 years ago Medicines:                Propofol per Anesthesia, Monitored Anesthesia Care Procedure:                Pre-Anesthesia Assessment:                           - Prior to the procedure, a History and Physical                            was performed, and patient medications and                            allergies were reviewed. The patient's tolerance of                            previous anesthesia was also reviewed. The risks                            and benefits of the procedure and the sedation                            options and risks were discussed with the patient.                            All questions were answered, and informed consent                            was obtained. Prior Anticoagulants: The patient has                            taken no previous anticoagulant or antiplatelet                            agents. ASA Grade Assessment: II - A patient with                            mild systemic disease. After reviewing the risks                            and benefits, the patient was deemed in                            satisfactory condition to undergo the procedure.  After obtaining informed consent, the colonoscope                            was passed under direct vision. Throughout the                            procedure, the patient's blood pressure, pulse, and                            oxygen saturations were monitored continuously. The                            Colonoscope was introduced through the anus and    advanced to the the cecum, identified by                            appendiceal orifice and ileocecal valve. The                            colonoscopy was somewhat difficult due to a                            tortuous colon. The patient tolerated the procedure                            well. The quality of the bowel preparation was                            adequate. Scope In: 8:51:03 AM Scope Out: 9:13:34 AM Scope Withdrawal Time: 0 hours 19 minutes 5 seconds  Total Procedure Duration: 0 hours 22 minutes 31 seconds  Findings:                 Skin tags were found on perianal exam.                           Hemorrhoids were found on perianal exam.                           The digital rectal exam findings include decreased                            sphincter tone.                           Three sessile polyps were found in the sigmoid                            colon and transverse colon. The polyps were                            diminutive in size. These polyps were removed with                            a cold snare. Resection and retrieval were  complete. Verification of patient identification                            for the specimen was done. Estimated blood loss was                            minimal.                           Multiple large-mouthed diverticula were found in                            the sigmoid colon, descending colon and transverse                            colon. There was no evidence of diverticular                            bleeding.                           External and internal hemorrhoids were found.                           The exam was otherwise without abnormality on                            direct and retroflexion views. Complications:            No immediate complications. Estimated Blood Loss:     Estimated blood loss was minimal. Impression:               - Perianal skin tags found on perianal exam.                            - Hemorrhoids found on perianal exam.                           - Decreased sphincter tone found on digital rectal                            exam.                           - Three diminutive polyps in the sigmoid colon and                            in the transverse colon, removed with a cold snare.                            Resected and retrieved.                           - Severe diverticulosis in the sigmoid colon, in  the descending colon and in the transverse colon.                            There was no evidence of diverticular bleeding.                           - External and internal hemorrhoids.                           - The examination was otherwise normal on direct                            and retroflexion views.                           - Personal history of colonic polyps. Recommendation:           - Patient has a contact number available for                            emergencies. The signs and symptoms of potential                            delayed complications were discussed with the                            patient. Return to normal activities tomorrow.                            Written discharge instructions were provided to the                            patient.                           - High fiber diet.                           - Use Benefiber one teaspoon PO daily.                           - No repeat colonoscopy due to age. Gatha Mayer, MD 02/18/2018 9:26:23 AM This report has been signed electronically.

## 2018-02-20 ENCOUNTER — Telehealth: Payer: Self-pay | Admitting: *Deleted

## 2018-02-20 NOTE — Telephone Encounter (Signed)
Second follow up call attempt.  No answer or voicemail option 

## 2018-02-20 NOTE — Telephone Encounter (Signed)
First follow up call attempt.  No answer or voicemail option. °

## 2018-02-23 ENCOUNTER — Encounter: Payer: Self-pay | Admitting: Internal Medicine

## 2018-02-23 NOTE — Progress Notes (Signed)
1 dimin ssp No recall - age

## 2018-04-21 DIAGNOSIS — R300 Dysuria: Secondary | ICD-10-CM | POA: Diagnosis not present

## 2018-05-20 DIAGNOSIS — R109 Unspecified abdominal pain: Secondary | ICD-10-CM | POA: Diagnosis not present

## 2018-05-20 DIAGNOSIS — R300 Dysuria: Secondary | ICD-10-CM | POA: Diagnosis not present

## 2018-08-01 DIAGNOSIS — H26491 Other secondary cataract, right eye: Secondary | ICD-10-CM | POA: Diagnosis not present

## 2018-08-01 DIAGNOSIS — H5203 Hypermetropia, bilateral: Secondary | ICD-10-CM | POA: Diagnosis not present

## 2018-08-01 DIAGNOSIS — Z961 Presence of intraocular lens: Secondary | ICD-10-CM | POA: Diagnosis not present

## 2018-08-01 DIAGNOSIS — H52203 Unspecified astigmatism, bilateral: Secondary | ICD-10-CM | POA: Diagnosis not present

## 2018-08-01 DIAGNOSIS — H524 Presbyopia: Secondary | ICD-10-CM | POA: Diagnosis not present

## 2018-08-04 DIAGNOSIS — H26491 Other secondary cataract, right eye: Secondary | ICD-10-CM | POA: Diagnosis not present

## 2018-10-23 DIAGNOSIS — L218 Other seborrheic dermatitis: Secondary | ICD-10-CM | POA: Diagnosis not present

## 2018-11-11 DIAGNOSIS — E782 Mixed hyperlipidemia: Secondary | ICD-10-CM | POA: Diagnosis not present

## 2018-11-11 DIAGNOSIS — Z1389 Encounter for screening for other disorder: Secondary | ICD-10-CM | POA: Diagnosis not present

## 2018-11-11 DIAGNOSIS — E039 Hypothyroidism, unspecified: Secondary | ICD-10-CM | POA: Diagnosis not present

## 2018-11-11 DIAGNOSIS — R350 Frequency of micturition: Secondary | ICD-10-CM | POA: Diagnosis not present

## 2018-11-11 DIAGNOSIS — M25562 Pain in left knee: Secondary | ICD-10-CM | POA: Diagnosis not present

## 2018-11-11 DIAGNOSIS — I10 Essential (primary) hypertension: Secondary | ICD-10-CM | POA: Diagnosis not present

## 2018-11-11 DIAGNOSIS — Z8249 Family history of ischemic heart disease and other diseases of the circulatory system: Secondary | ICD-10-CM | POA: Diagnosis not present

## 2018-11-11 DIAGNOSIS — E559 Vitamin D deficiency, unspecified: Secondary | ICD-10-CM | POA: Diagnosis not present

## 2018-11-11 DIAGNOSIS — D126 Benign neoplasm of colon, unspecified: Secondary | ICD-10-CM | POA: Diagnosis not present

## 2018-11-11 DIAGNOSIS — F419 Anxiety disorder, unspecified: Secondary | ICD-10-CM | POA: Diagnosis not present

## 2018-11-11 DIAGNOSIS — Z23 Encounter for immunization: Secondary | ICD-10-CM | POA: Diagnosis not present

## 2018-11-11 DIAGNOSIS — Z0001 Encounter for general adult medical examination with abnormal findings: Secondary | ICD-10-CM | POA: Diagnosis not present

## 2019-01-06 ENCOUNTER — Telehealth: Payer: Self-pay | Admitting: Interventional Cardiology

## 2019-01-06 DIAGNOSIS — G453 Amaurosis fugax: Secondary | ICD-10-CM | POA: Diagnosis not present

## 2019-01-06 NOTE — Telephone Encounter (Signed)
Pt and her husband called to report the pt had an episode of her Right eye going "black" temporarily last week. 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   She reports that Dr. 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 she also reports that her parents both had carotid artery disease.  Pt is going to call Dr. Inda Merlin to be seen ASAP today and to report to him the findings with her eye and the eye MD today.   Will forward to Dr. Tamala Julian.

## 2019-01-07 NOTE — Telephone Encounter (Signed)
She needs bilateral carotid doppler and 2 D doppler echocardiogram

## 2019-01-07 NOTE — Telephone Encounter (Signed)
Spoke with pt and reviewed recommendations.  Advised I will place an order and those depts will reach out to get her scheduled.  Pt verbalized understanding and was appreciative for call.

## 2019-01-09 ENCOUNTER — Ambulatory Visit (HOSPITAL_COMMUNITY)
Admission: RE | Admit: 2019-01-09 | Discharge: 2019-01-09 | Disposition: A | Discharge status: Home / Self Care | Payer: MEDICARE | Source: Ambulatory Visit | Attending: Cardiology | Admitting: Cardiology

## 2019-01-09 ENCOUNTER — Other Ambulatory Visit: Payer: Self-pay | Admitting: *Deleted

## 2019-01-09 ENCOUNTER — Other Ambulatory Visit: Payer: Self-pay

## 2019-01-09 DIAGNOSIS — G453 Amaurosis fugax: Secondary | ICD-10-CM | POA: Insufficient documentation

## 2019-01-09 MED ORDER — ASPIRIN EC 81 MG PO TBEC: 81.0000 mg | DELAYED_RELEASE_TABLET | Freq: Every day | ORAL | 3 refills | Status: DC

## 2019-01-21 ENCOUNTER — Ambulatory Visit (HOSPITAL_COMMUNITY): Payer: MEDICARE | Attending: Cardiovascular Disease

## 2019-01-21 ENCOUNTER — Other Ambulatory Visit: Payer: Self-pay

## 2019-01-21 DIAGNOSIS — G453 Amaurosis fugax: Secondary | ICD-10-CM | POA: Diagnosis not present

## 2019-03-18 ENCOUNTER — Ambulatory Visit: Payer: MEDICARE

## 2019-03-27 ENCOUNTER — Ambulatory Visit: Payer: MEDICARE

## 2019-03-30 ENCOUNTER — Ambulatory Visit: Payer: MEDICARE | Attending: Internal Medicine

## 2019-03-30 DIAGNOSIS — Z23 Encounter for immunization: Secondary | ICD-10-CM | POA: Insufficient documentation

## 2019-03-30 NOTE — Progress Notes (Signed)
   Covid-19 Vaccination Clinic  Name:  Toni Jacobs    MRN: [id number] DOB: 1940/09/16  03/30/2019  [name] was observed post Covid-19 immunization for 15 minutes without incidence. She was provided with Vaccine Information Sheet and instruction to access the V-Safe system.   [name] was instructed to call 911 with any severe reactions post vaccine: [name] Difficulty breathing  . Swelling of your face and throat  . A fast heartbeat  . A bad rash all over your body  . Dizziness and weakness    Immunizations Administered    Name Date Dose VIS Date Route   Pfizer COVID-19 Vaccine 03/30/2019  8:44 AM 0.3 mL 01/30/2019 Intramuscular   Manufacturer: [place]   Lot: [id number]   [place]: [id number]

## 2019-04-20 DIAGNOSIS — M1990 Unspecified osteoarthritis, unspecified site: Secondary | ICD-10-CM | POA: Diagnosis not present

## 2019-04-20 DIAGNOSIS — E7800 Pure hypercholesterolemia, unspecified: Secondary | ICD-10-CM | POA: Diagnosis not present

## 2019-04-20 DIAGNOSIS — E782 Mixed hyperlipidemia: Secondary | ICD-10-CM | POA: Diagnosis not present

## 2019-04-20 DIAGNOSIS — I10 Essential (primary) hypertension: Secondary | ICD-10-CM | POA: Diagnosis not present

## 2019-04-20 DIAGNOSIS — E039 Hypothyroidism, unspecified: Secondary | ICD-10-CM | POA: Diagnosis not present

## 2019-04-23 ENCOUNTER — Ambulatory Visit: Payer: MEDICARE | Attending: Internal Medicine

## 2019-04-23 DIAGNOSIS — Z23 Encounter for immunization: Secondary | ICD-10-CM | POA: Insufficient documentation

## 2019-04-23 NOTE — Progress Notes (Signed)
   Covid-19 Vaccination Clinic  Name:  Toni Jacobs    MRN: [id number] DOB: 1940/02/22  04/23/2019  [name] was observed post Covid-19 immunization for 15 minutes without incident. She was provided with Vaccine Information Sheet and instruction to access the V-Safe system.   [name] was instructed to call 911 with any severe reactions post vaccine: [name] Difficulty breathing  . Swelling of face and throat  . A fast heartbeat  . A bad rash all over body  . Dizziness and weakness   Immunizations Administered    Name Date Dose VIS Date Route   Pfizer COVID-19 Vaccine 04/23/2019  4:05 PM 0.3 mL 01/30/2019 Intramuscular   Manufacturer: [place]   Lot: [id number]   [place]: [id number]

## 2019-05-28 DIAGNOSIS — E782 Mixed hyperlipidemia: Secondary | ICD-10-CM | POA: Diagnosis not present

## 2019-05-28 DIAGNOSIS — I10 Essential (primary) hypertension: Secondary | ICD-10-CM | POA: Diagnosis not present

## 2019-05-28 DIAGNOSIS — E039 Hypothyroidism, unspecified: Secondary | ICD-10-CM | POA: Diagnosis not present

## 2019-05-28 DIAGNOSIS — M1990 Unspecified osteoarthritis, unspecified site: Secondary | ICD-10-CM | POA: Diagnosis not present

## 2019-05-28 DIAGNOSIS — E7800 Pure hypercholesterolemia, unspecified: Secondary | ICD-10-CM | POA: Diagnosis not present

## 2019-06-25 DIAGNOSIS — E039 Hypothyroidism, unspecified: Secondary | ICD-10-CM | POA: Diagnosis not present

## 2019-06-25 DIAGNOSIS — M1990 Unspecified osteoarthritis, unspecified site: Secondary | ICD-10-CM | POA: Diagnosis not present

## 2019-06-25 DIAGNOSIS — E782 Mixed hyperlipidemia: Secondary | ICD-10-CM | POA: Diagnosis not present

## 2019-06-25 DIAGNOSIS — E7800 Pure hypercholesterolemia, unspecified: Secondary | ICD-10-CM | POA: Diagnosis not present

## 2019-06-25 DIAGNOSIS — I10 Essential (primary) hypertension: Secondary | ICD-10-CM | POA: Diagnosis not present

## 2019-07-27 DIAGNOSIS — I10 Essential (primary) hypertension: Secondary | ICD-10-CM | POA: Diagnosis not present

## 2019-07-27 DIAGNOSIS — E782 Mixed hyperlipidemia: Secondary | ICD-10-CM | POA: Diagnosis not present

## 2019-07-27 DIAGNOSIS — M1990 Unspecified osteoarthritis, unspecified site: Secondary | ICD-10-CM | POA: Diagnosis not present

## 2019-07-27 DIAGNOSIS — E7800 Pure hypercholesterolemia, unspecified: Secondary | ICD-10-CM | POA: Diagnosis not present

## 2019-07-27 DIAGNOSIS — E039 Hypothyroidism, unspecified: Secondary | ICD-10-CM | POA: Diagnosis not present

## 2019-08-17 DIAGNOSIS — H5203 Hypermetropia, bilateral: Secondary | ICD-10-CM | POA: Diagnosis not present

## 2019-08-17 DIAGNOSIS — H02831 Dermatochalasis of right upper eyelid: Secondary | ICD-10-CM | POA: Diagnosis not present

## 2019-08-17 DIAGNOSIS — H43813 Vitreous degeneration, bilateral: Secondary | ICD-10-CM | POA: Diagnosis not present

## 2019-08-17 DIAGNOSIS — Z961 Presence of intraocular lens: Secondary | ICD-10-CM | POA: Diagnosis not present

## 2019-08-17 DIAGNOSIS — H52203 Unspecified astigmatism, bilateral: Secondary | ICD-10-CM | POA: Diagnosis not present

## 2019-08-17 DIAGNOSIS — H02834 Dermatochalasis of left upper eyelid: Secondary | ICD-10-CM | POA: Diagnosis not present

## 2019-08-17 DIAGNOSIS — H524 Presbyopia: Secondary | ICD-10-CM | POA: Diagnosis not present

## 2019-08-17 DIAGNOSIS — H16223 Keratoconjunctivitis sicca, not specified as Sjogren's, bilateral: Secondary | ICD-10-CM | POA: Diagnosis not present

## 2019-08-17 DIAGNOSIS — Z8669 Personal history of other diseases of the nervous system and sense organs: Secondary | ICD-10-CM | POA: Diagnosis not present

## 2019-10-13 DIAGNOSIS — I10 Essential (primary) hypertension: Secondary | ICD-10-CM | POA: Diagnosis not present

## 2019-10-13 DIAGNOSIS — E782 Mixed hyperlipidemia: Secondary | ICD-10-CM | POA: Diagnosis not present

## 2019-10-13 DIAGNOSIS — E7800 Pure hypercholesterolemia, unspecified: Secondary | ICD-10-CM | POA: Diagnosis not present

## 2019-10-13 DIAGNOSIS — M1990 Unspecified osteoarthritis, unspecified site: Secondary | ICD-10-CM | POA: Diagnosis not present

## 2019-10-13 DIAGNOSIS — E039 Hypothyroidism, unspecified: Secondary | ICD-10-CM | POA: Diagnosis not present

## 2019-11-23 DIAGNOSIS — G5602 Carpal tunnel syndrome, left upper limb: Secondary | ICD-10-CM | POA: Diagnosis not present

## 2019-11-23 DIAGNOSIS — M13842 Other specified arthritis, left hand: Secondary | ICD-10-CM | POA: Diagnosis not present

## 2019-11-23 DIAGNOSIS — M79642 Pain in left hand: Secondary | ICD-10-CM | POA: Diagnosis not present

## 2019-11-26 DIAGNOSIS — Z23 Encounter for immunization: Secondary | ICD-10-CM | POA: Diagnosis not present

## 2019-12-03 DIAGNOSIS — N390 Urinary tract infection, site not specified: Secondary | ICD-10-CM | POA: Diagnosis not present

## 2019-12-03 DIAGNOSIS — Z0001 Encounter for general adult medical examination with abnormal findings: Secondary | ICD-10-CM | POA: Diagnosis not present

## 2019-12-03 DIAGNOSIS — E559 Vitamin D deficiency, unspecified: Secondary | ICD-10-CM | POA: Diagnosis not present

## 2019-12-03 DIAGNOSIS — E039 Hypothyroidism, unspecified: Secondary | ICD-10-CM | POA: Diagnosis not present

## 2019-12-03 DIAGNOSIS — R350 Frequency of micturition: Secondary | ICD-10-CM | POA: Diagnosis not present

## 2019-12-03 DIAGNOSIS — Z8249 Family history of ischemic heart disease and other diseases of the circulatory system: Secondary | ICD-10-CM | POA: Diagnosis not present

## 2019-12-03 DIAGNOSIS — E669 Obesity, unspecified: Secondary | ICD-10-CM | POA: Diagnosis not present

## 2019-12-03 DIAGNOSIS — I10 Essential (primary) hypertension: Secondary | ICD-10-CM | POA: Diagnosis not present

## 2019-12-03 DIAGNOSIS — D126 Benign neoplasm of colon, unspecified: Secondary | ICD-10-CM | POA: Diagnosis not present

## 2019-12-03 DIAGNOSIS — G4761 Periodic limb movement disorder: Secondary | ICD-10-CM | POA: Diagnosis not present

## 2019-12-03 DIAGNOSIS — E782 Mixed hyperlipidemia: Secondary | ICD-10-CM | POA: Diagnosis not present

## 2019-12-03 DIAGNOSIS — Z79899 Other long term (current) drug therapy: Secondary | ICD-10-CM | POA: Diagnosis not present

## 2019-12-17 ENCOUNTER — Encounter (INDEPENDENT_AMBULATORY_CARE_PROVIDER_SITE_OTHER): Payer: Self-pay | Admitting: Family Medicine

## 2019-12-17 DIAGNOSIS — E7800 Pure hypercholesterolemia, unspecified: Secondary | ICD-10-CM | POA: Diagnosis not present

## 2019-12-17 DIAGNOSIS — I10 Essential (primary) hypertension: Secondary | ICD-10-CM | POA: Diagnosis not present

## 2019-12-17 DIAGNOSIS — M179 Osteoarthritis of knee, unspecified: Secondary | ICD-10-CM | POA: Diagnosis not present

## 2019-12-17 DIAGNOSIS — N390 Urinary tract infection, site not specified: Secondary | ICD-10-CM | POA: Diagnosis not present

## 2019-12-17 DIAGNOSIS — E039 Hypothyroidism, unspecified: Secondary | ICD-10-CM | POA: Diagnosis not present

## 2019-12-17 DIAGNOSIS — E782 Mixed hyperlipidemia: Secondary | ICD-10-CM | POA: Diagnosis not present

## 2019-12-17 DIAGNOSIS — M1990 Unspecified osteoarthritis, unspecified site: Secondary | ICD-10-CM | POA: Diagnosis not present

## 2019-12-21 NOTE — Telephone Encounter (Signed)
Please address

## 2019-12-28 ENCOUNTER — Ambulatory Visit (INDEPENDENT_AMBULATORY_CARE_PROVIDER_SITE_OTHER): Payer: MEDICARE | Admitting: Family Medicine

## 2020-01-11 ENCOUNTER — Ambulatory Visit (INDEPENDENT_AMBULATORY_CARE_PROVIDER_SITE_OTHER): Payer: MEDICARE | Admitting: Family Medicine

## 2020-01-31 DIAGNOSIS — Z23 Encounter for immunization: Secondary | ICD-10-CM | POA: Diagnosis not present

## 2020-02-09 DIAGNOSIS — I10 Essential (primary) hypertension: Secondary | ICD-10-CM | POA: Diagnosis not present

## 2020-02-09 DIAGNOSIS — E782 Mixed hyperlipidemia: Secondary | ICD-10-CM | POA: Diagnosis not present

## 2020-02-09 DIAGNOSIS — M179 Osteoarthritis of knee, unspecified: Secondary | ICD-10-CM | POA: Diagnosis not present

## 2020-02-09 DIAGNOSIS — E7800 Pure hypercholesterolemia, unspecified: Secondary | ICD-10-CM | POA: Diagnosis not present

## 2020-02-09 DIAGNOSIS — M1990 Unspecified osteoarthritis, unspecified site: Secondary | ICD-10-CM | POA: Diagnosis not present

## 2020-02-09 DIAGNOSIS — E039 Hypothyroidism, unspecified: Secondary | ICD-10-CM | POA: Diagnosis not present

## 2020-03-01 ENCOUNTER — Telehealth: Payer: Self-pay

## 2020-03-01 ENCOUNTER — Ambulatory Visit (INDEPENDENT_AMBULATORY_CARE_PROVIDER_SITE_OTHER): Payer: MEDICARE

## 2020-03-01 ENCOUNTER — Ambulatory Visit (INDEPENDENT_AMBULATORY_CARE_PROVIDER_SITE_OTHER): Payer: MEDICARE | Admitting: Orthopaedic Surgery

## 2020-03-01 VITALS — Ht 63.5 in | Wt 190.0 lb

## 2020-03-01 DIAGNOSIS — M1712 Unilateral primary osteoarthritis, left knee: Secondary | ICD-10-CM

## 2020-03-01 DIAGNOSIS — G8929 Other chronic pain: Secondary | ICD-10-CM

## 2020-03-01 DIAGNOSIS — M25562 Pain in left knee: Secondary | ICD-10-CM | POA: Diagnosis not present

## 2020-03-01 MED ORDER — METHYLPREDNISOLONE ACETATE 40 MG/ML IJ SUSP
40.0000 mg | INTRAMUSCULAR | Status: AC | PRN
  Administered 2020-03-01: 40 mg via INTRA_ARTICULAR

## 2020-03-01 MED ORDER — MELOXICAM 15 MG PO TABS: 15.0000 mg | ORAL_TABLET | Freq: Every day | ORAL | 3 refills | Status: DC | PRN

## 2020-03-01 MED ORDER — LIDOCAINE HCL 1 % IJ SOLN
3.0000 mL | INTRAMUSCULAR | Status: AC | PRN
  Administered 2020-03-01: 3 mL

## 2020-03-01 NOTE — Progress Notes (Signed)
Office Visit Note   Patient: Toni Jacobs           Date of Birth: 1940-12-19           MRN: [id number] Visit Date: 03/01/2020              Requested by: Josetta Huddle, MD [address] [place],  [place] [address] PCP: Josetta Huddle, MD   Assessment & Plan: Visit Diagnoses:  1. Left knee pain, unspecified chronicity   2. Chronic pain of left knee   3. Unilateral primary osteoarthritis, left knee     Plan: Certainly her weight loss has contributed to her knee hurting less.  She is heading in the right direction.  She should continue quad strengthening exercises as well.  She can try over-the-counter Voltaren gel and I will start meloxicam as an anti-inflammatory to take just once daily as needed for arthritis type of pain with her knee.  Also recommended steroid injection for her left knee since she has not tried this before and she agreed to this and tolerated it well.  Given the patellofemoral arthritis she is the perfect candidate for trying hyaluronic acid and she is interested in this as well.  All questions and concerns were answered and addressed.  She tolerated the steroid injection her left knee well.  Hopefully in 4 weeks we can place hyaluronic acid in her left knee to treat the pain from long-term osteoarthritis.  Follow-Up Instructions: Return in about 4 weeks (around 03/29/2020).   Orders:  Orders Placed This Encounter  Procedures  . Large Joint Inj  . XR Knee 1-2 Views Left   Meds ordered this encounter  Medications  . meloxicam (MOBIC) 15 MG tablet    Sig: Take 1 tablet (15 mg total) by mouth daily as needed for pain.    Dispense:  30 tablet    Refill:  3      Procedures: Large Joint Inj: L knee on 03/01/2020 10:16 AM Indications: diagnostic evaluation and pain Details: 22 G 1.5 in needle, superolateral approach  Arthrogram: No  Medications: 3 mL lidocaine 1 %; 40 mg methylPREDNISolone acetate 40 MG/ML Outcome: tolerated well, no immediate  complications Procedure, treatment alternatives, risks and benefits explained, specific risks discussed. Consent was given by the patient. Immediately prior to procedure a time out was called to verify the correct patient, procedure, equipment, support staff and site/side marked as required. Patient was prepped and draped in the usual sterile fashion.       Clinical Data: No additional findings.   Subjective: Chief Complaint  Patient presents with  . Left Knee - Pain  The patient is a very pleasant 80 year old female referred from Dr. Inda Merlin to evaluate and treat chronic left knee pain.  She says is worse when she goes up and down stairs especially at house that they have that is at [employer].  She denies any injuries.  She has lost some weight and that is really helped her knee feel better.  Is more of a global pain.  Does not lock and catch on her.  She has not had surgery on that knee or injured the knee that she is aware of.  She is not a diabetic.  Again it seems to hurt more going up and down stairs.  HPI  Review of Systems She currently denies any headache, chest pain, shortness of breath, fever, chills, nausea, vomiting  Objective: Vital Signs: Ht 5' 3.5" (1.613 m)  Wt 190 lb (86.2 kg)   BMI 33.13 kg/m   Physical Exam She is alert and oriented x3 and in no acute distress Ortho Exam Examination of her left knee she has no effusion.  There is patellofemoral crepitation and pain over the patella tendon but excellent range of motion of the knee.  Her left knee is ligamentously stable. Specialty Comments:  No specialty comments available.  Imaging: XR Knee 1-2 Views Left  Result Date: 03/01/2020 X-rays of the left knee showed no acute findings.  Alignment is neutral.  There is patellofemoral arthritic changes.  X-rays on the canopy system of her left knee showed no acute findings.  There are some patellofemoral arthritic changes.  PMFS History: Patient Active Problem  List   Diagnosis Date Noted  . Carpal tunnel syndrome, 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  . 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 Lax, ligament 08/28/2011  . VFD (visual field defect) 08/28/2011  . HYPOTHYROIDISM 04/01/2009  . HYPERTENSION 04/01/2009  . COUGH 04/01/2009  . DIVERTICULITIS, HX OF 04/01/2009   Past Medical History:  Diagnosis Date  . Anxiety   . Carpal tunnel syndrome, 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  . Chest pain syndrome   . Diverticulitis   . GERD (gastroesophageal reflux disease)   . Hypercholesteremia   . Hypertension   . Hypothyroidism   . Impacted ear wax   . Obesity   . Osteoarthritis   . Situational stress   . Sleep apnea    no CPAP  . Spasm   . Thyroid disease   . Tubular adenoma of colon   . Vitamin D deficiency     Family History  Problem Relation Age of Onset  . Heart disease Mother   . Heart disease Father   . Hypertension Brother   . Heart disease Brother   . Diabetes Brother   . Breast cancer Neg Hx   . Colon cancer Neg Hx   . Esophageal cancer Neg Hx   . Rectal cancer Neg Hx   . Stomach cancer Neg Hx     Past Surgical History:  Procedure Laterality Date  . ABDOMINAL HYSTERECTOMY  1986  . APPENDECTOMY    . CATARACT EXTRACTION W/ INTRAOCULAR LENS IMPLANT Bilateral 07/21/15, 09/22/15  . COLONOSCOPY    . TOE SURGERY    . Unilateral Ooophorectomy     Social History   Occupational History    Employer: RETIRED  Tobacco Use  . Smoking status: Never Smoker  . Smokeless tobacco: Never Used  Vaping Use  . Vaping Use: Never used  Substance and Sexual Activity  . Alcohol use: Yes    Alcohol/week: 1.0 standard drink    Types: 1 Glasses of wine per week    Comment: occ  . Drug use: Never  . Sexual activity: Not on file

## 2020-03-01 NOTE — Telephone Encounter (Signed)
Left knee gel injection ?

## 2020-03-09 NOTE — Telephone Encounter (Signed)
Can you help with this? Autumn has been out all week and likely not back til Tuesday 

## 2020-03-11 NOTE — Telephone Encounter (Signed)
Noted  

## 2020-03-15 DIAGNOSIS — E039 Hypothyroidism, unspecified: Secondary | ICD-10-CM | POA: Diagnosis not present

## 2020-03-15 DIAGNOSIS — E782 Mixed hyperlipidemia: Secondary | ICD-10-CM | POA: Diagnosis not present

## 2020-03-15 DIAGNOSIS — I10 Essential (primary) hypertension: Secondary | ICD-10-CM | POA: Diagnosis not present

## 2020-03-15 DIAGNOSIS — M1990 Unspecified osteoarthritis, unspecified site: Secondary | ICD-10-CM | POA: Diagnosis not present

## 2020-03-15 DIAGNOSIS — E7800 Pure hypercholesterolemia, unspecified: Secondary | ICD-10-CM | POA: Diagnosis not present

## 2020-03-15 DIAGNOSIS — M179 Osteoarthritis of knee, unspecified: Secondary | ICD-10-CM | POA: Diagnosis not present

## 2020-03-23 ENCOUNTER — Telehealth: Payer: Self-pay

## 2020-03-23 NOTE — Telephone Encounter (Signed)
Submitted VOB, SynviscOne, left knee. 

## 2020-03-31 ENCOUNTER — Telehealth: Payer: Self-pay

## 2020-03-31 NOTE — Telephone Encounter (Signed)
Approved, SynviscOne, left knee. Buy & Bill Covered at 100% after Medicare pays per [facility]. No Co-pay No PA required  Appt. 04/05/2020 with Dr. Ninfa Linden

## 2020-04-05 ENCOUNTER — Ambulatory Visit (INDEPENDENT_AMBULATORY_CARE_PROVIDER_SITE_OTHER): Payer: MEDICARE | Admitting: Orthopaedic Surgery

## 2020-04-05 ENCOUNTER — Encounter: Payer: Self-pay | Admitting: Orthopaedic Surgery

## 2020-04-05 DIAGNOSIS — M1712 Unilateral primary osteoarthritis, left knee: Secondary | ICD-10-CM | POA: Diagnosis not present

## 2020-04-05 MED ORDER — HYLAN G-F 20 48 MG/6ML IX SOSY
48.0000 mg | PREFILLED_SYRINGE | INTRA_ARTICULAR | Status: AC | PRN
  Administered 2020-04-05: 48 mg via INTRA_ARTICULAR

## 2020-04-05 MED ORDER — LIDOCAINE HCL 1 % IJ SOLN
1.0000 mL | INTRAMUSCULAR | Status: AC | PRN
  Administered 2020-04-05: 1 mL

## 2020-04-05 MED ORDER — MELOXICAM 15 MG PO TABS: 15.0000 mg | ORAL_TABLET | Freq: Every day | ORAL | 3 refills | Status: DC | PRN

## 2020-04-05 NOTE — Progress Notes (Signed)
   Procedure Note  Patient: Toni Jacobs             Date of Birth: 02-28-1940           MRN: [id number]             Visit Date: 04/05/2020 HPI: Toni Jacobs comes in today for left knee Synvisc 1 injection.  She has known patellofemoral arthritic changes.  She states that the mobility has helped with her knee.  She is having difficulty going up and down stairs though.  She does want a refill on Mobic today.  No known injury to the left knee.  Physical exam: Left knee no abnormal warmth erythema.  Range of motion reveals patellofemoral crepitus.  Overall good range of motion left knee.  Procedures: Visit Diagnoses:  1. Unilateral primary osteoarthritis, left knee     Large Joint Inj on 04/05/2020 10:22 AM Medications: 48 mg Hylan 48 MG/6ML; 1 mL lidocaine 1 %   Plan: We will have her follow-up with [place] in 8 weeks to see what type of response she had to the Synvisc 1 injection today.  Questions encouraged and answered at length.  Discussed with her that she had the injections no more than every 6 months.  Also discussed with her that it may take 4 to 6 weeks for the injection to begin working.  Questions were encouraged and answered.  Refill Mobic was sent in

## 2020-05-10 DIAGNOSIS — Z0289 Encounter for other administrative examinations: Secondary | ICD-10-CM

## 2020-05-18 DIAGNOSIS — E782 Mixed hyperlipidemia: Secondary | ICD-10-CM | POA: Diagnosis not present

## 2020-05-18 DIAGNOSIS — I10 Essential (primary) hypertension: Secondary | ICD-10-CM | POA: Diagnosis not present

## 2020-05-18 DIAGNOSIS — E039 Hypothyroidism, unspecified: Secondary | ICD-10-CM | POA: Diagnosis not present

## 2020-05-18 DIAGNOSIS — M1990 Unspecified osteoarthritis, unspecified site: Secondary | ICD-10-CM | POA: Diagnosis not present

## 2020-05-18 DIAGNOSIS — E7800 Pure hypercholesterolemia, unspecified: Secondary | ICD-10-CM | POA: Diagnosis not present

## 2020-05-18 DIAGNOSIS — M179 Osteoarthritis of knee, unspecified: Secondary | ICD-10-CM | POA: Diagnosis not present

## 2020-05-31 ENCOUNTER — Other Ambulatory Visit: Payer: Self-pay

## 2020-05-31 ENCOUNTER — Ambulatory Visit (INDEPENDENT_AMBULATORY_CARE_PROVIDER_SITE_OTHER): Payer: MEDICARE | Admitting: Family Medicine

## 2020-05-31 ENCOUNTER — Encounter (INDEPENDENT_AMBULATORY_CARE_PROVIDER_SITE_OTHER): Payer: Self-pay | Admitting: Family Medicine

## 2020-05-31 VITALS — BP 131/82 | HR 63 | Temp 97.6°F | Ht 63.0 in | Wt 189.0 lb

## 2020-05-31 DIAGNOSIS — E039 Hypothyroidism, unspecified: Secondary | ICD-10-CM | POA: Diagnosis not present

## 2020-05-31 DIAGNOSIS — R5383 Other fatigue: Secondary | ICD-10-CM | POA: Diagnosis not present

## 2020-05-31 DIAGNOSIS — E669 Obesity, unspecified: Secondary | ICD-10-CM

## 2020-05-31 DIAGNOSIS — R0602 Shortness of breath: Secondary | ICD-10-CM

## 2020-05-31 DIAGNOSIS — Z1331 Encounter for screening for depression: Secondary | ICD-10-CM

## 2020-05-31 DIAGNOSIS — E785 Hyperlipidemia, unspecified: Secondary | ICD-10-CM | POA: Diagnosis not present

## 2020-05-31 DIAGNOSIS — E559 Vitamin D deficiency, unspecified: Secondary | ICD-10-CM

## 2020-05-31 DIAGNOSIS — F419 Anxiety disorder, unspecified: Secondary | ICD-10-CM

## 2020-05-31 DIAGNOSIS — I10 Essential (primary) hypertension: Secondary | ICD-10-CM

## 2020-05-31 DIAGNOSIS — R739 Hyperglycemia, unspecified: Secondary | ICD-10-CM | POA: Diagnosis not present

## 2020-05-31 DIAGNOSIS — E538 Deficiency of other specified B group vitamins: Secondary | ICD-10-CM

## 2020-05-31 DIAGNOSIS — Z6833 Body mass index (BMI) 33.0-33.9, adult: Secondary | ICD-10-CM

## 2020-05-31 NOTE — Progress Notes (Unsigned)
Office: [phone]  /  Fax: [phone]    Date: June 13, 2020   Appointment Start Time: *** Duration: *** minutes Provider: Glennie Isle, Psy.D. Type of Session: Intake for Individual Therapy  Location of Patient: {gbptloc:23249} Location of Provider: Provider's Home (private office) Type of Contact: Telepsychological Visit via MyChart Video Visit  Informed Consent: Prior to proceeding with today's appointment, two pieces of identifying information were obtained. In addition, Toni Jacobs's physical location at the time of this appointment was obtained as well a phone number she could be reached at in the event of technical difficulties. Toni Jacobs and this provider participated in today's telepsychological service.   The provider's role was explained to [employer]. The provider reviewed and discussed issues of confidentiality, privacy, and limits therein (e.g., reporting obligations). In addition to verbal informed consent, written informed consent for psychological services was obtained prior to the initial appointment. Since the clinic is not a 24/7 crisis center, mental health emergency resources were shared and this  provider explained MyChart, e-mail, voicemail, and/or other messaging systems should be utilized only for non-emergency reasons. This provider also explained that information obtained during appointments will be placed in Toni Jacobs's medical record and relevant information will be shared with other providers at [facility] for coordination of care. Toni Jacobs agreed information may be shared with other [facility] providers as needed for coordination of care and by signing the service agreement document, she provided written consent for coordination of care. Prior to initiating telepsychological services, Meah completed an informed consent document, which included the development of a safety plan (i.e., an emergency contact and emergency resources) in the  event of an emergency/crisis. Toni Jacobs expressed understanding of the rationale of the safety plan. Toni Jacobs verbally acknowledged understanding she is ultimately responsible for understanding her insurance benefits for telepsychological and in-person services. This provider also reviewed confidentiality, as it relates to telepsychological services, as well as the rationale for telepsychological services (i.e., to reduce exposure risk to COVID-19). Toni Jacobs  acknowledged understanding that appointments cannot be recorded without both party consent and she is aware she is responsible for securing confidentiality on her end of the session. Toni Jacobs verbally consented to proceed.  Chief Complaint/HPI: Toni Jacobs was referred by {gbproviders:21756} due to {Reason for Referrals:22136}. Per the note for the initial visit with Toni Jacobs on May 31, 2020, "***" The note for the initial appointment with Toni Jacobs indicated the following: "***" Toni Jacobs's Food and Mood (modified PHQ-9) score on May 31, 2020 was 27.  During today's appointment, Toni Jacobs was verbally administered a questionnaire assessing various behaviors related to emotional eating. Toni Jacobs endorsed the following: {gbmoodandfood:21755}. She shared she craves ***. Toni Jacobs believes the onset of emotional eating was *** and described the current frequency of emotional eating as ***. In addition, Toni Jacobs {gblegal:22371} a history of binge eating. *** Currently, Toni Jacobs indicated *** triggers emotional eating, whereas *** makes emotional eating better. Furthermore, Toni Jacobs {gblegal:22371} other problems of concern. ***   Mental Status Examination:  Appearance: {Appearance:22431} Behavior: {Behavior:22445} Mood: {gbmood:21757} Affect: {Affect:22436} Speech: {Speech:22432} Eye Contact: {Eye Contact:22433} Psychomotor Activity: {Motor Activity:22434} Gait: {gbgait:23404} Thought Process: {thought process:22448}  Thought Content/Perception:  {disturbances:22451} Orientation: {Orientation:22437} Memory/Concentration: {gbcognition:22449} Insight/Judgment: {Insight:22446}  Family & Psychosocial History: Toni Jacobs reported she is *** and ***. She indicated she is currently ***. Additionally, Toni Jacobs shared her highest level of education obtained is ***. Currently, Toni Jacobs's social support system consists of her ***. Moreover, Toni Jacobs stated she resides with her ***.   Medical  History: ***  Mental Health History: Toni Jacobs reported ***. Toni Jacobs {Endorse or deny of item:23407} hospitalizations for psychiatric concerns. Toni Jacobs {gblegal:22371} a family history of mental health related concerns. *** Toni Jacobs {Endorse or deny of item:23407} trauma including {gbtrauma:22071} abuse, as well as neglect. Toni Jacobs described her typical mood lately as ***. Aside from concerns noted above and endorsed on the PHQ-9 and GAD-7, Toni Jacobs reported ***. Toni Jacobs {gblegal:22371} current alcohol use. *** She {gblegal:22371} tobacco use. *** She {KGMWNUU:72536} illicit/recreational substance use. Regarding caffeine intake, Honor reported ***. Furthermore, Birdena indicated she is not experiencing the following: {gbsxs:21965}. She also denied history of and current suicidal ideation, plan, and intent; history of and current homicidal ideation, plan, and intent; and history of and current engagement in self-harm. Notably, Gracia endorsed item 9 (i.e., "Do you feel that your weight problem is so hopeless that sometimes life doesn't seem worth living?") on the modified PHQ-9 during her initial appointment with Toni Jacobs on May 31, 2020. ***  The following strengths were reported by Toni Jacobs: ***. The following strengths were observed by this provider: ability to express thoughts and feelings during the therapeutic session, ability to establish and benefit from a therapeutic relationship, willingness to work toward established goal(s) with the clinic and ability to engage in reciprocal  conversation. ***  Legal History: Reniya {Endorse or deny of item:23407} legal involvement.   Structured Assessments Results: The Patient Health Questionnaire-9 (PHQ-9) is a self-report measure that assesses symptoms and severity of depression over the course of the last two weeks. Thaily obtained a score of *** suggesting {GBPHQ9SEVERITY:21752}. Crystie finds the endorsed symptoms to be {gbphq9difficulty:21754}. [0= Not at all; 1= Several days; 2= More than half the days; 3= Nearly every day] Little interest or pleasure in doing things ***  Feeling down, depressed, or hopeless ***  Trouble falling or staying asleep, or sleeping too much ***  Feeling tired or having little energy ***  Poor appetite or overeating ***  Feeling bad about yourself --- or that you are a failure or have let yourself or your family down ***  Trouble concentrating on things, such as reading the newspaper or watching television ***  Moving or speaking so slowly that other people could have noticed? Or the opposite --- being so fidgety or restless that you have been moving around a lot more than usual ***  Thoughts that you would be better off dead or hurting yourself in some way ***  PHQ-9 Score ***    The Generalized Anxiety Disorder-7 (GAD-7) is a brief self-report measure that assesses symptoms of anxiety over the course of the last two weeks. Marshea obtained a score of *** suggesting {gbgad7severity:21753}. Arlene finds the endorsed symptoms to be {gbphq9difficulty:21754}. [0= Not at all; 1= Several days; 2= Over half the days; 3= Nearly every day] Feeling nervous, anxious, on edge ***  Not being able to stop or control worrying ***  Worrying too much about different things ***  Trouble relaxing ***  Being so restless that it's hard to sit still ***  Becoming easily annoyed or irritable ***  Feeling afraid as if something awful might happen ***  GAD-7 Score ***   Interventions:  {Interventions List for  Intake:23406}  Provisional DSM-5 Diagnosis(es): {Diagnoses:22752}  Plan: Ahmia appears able and willing to participate as evidenced by collaboration on a treatment goal, engagement in reciprocal conversation, and asking questions as needed for clarification. The next appointment will be scheduled in {gbweeks:21758}, which will be {gbtxmodality:23402}. The following treatment  goal was established: {gbtxgoals:21759}. This provider will regularly review the treatment plan and medical chart to keep informed of status changes. Aleza expressed understanding and agreement with the initial treatment plan of care. *** Lucette will be sent a handout via e-mail to utilize between now and the next appointment to increase awareness of hunger patterns and subsequent eating. Toni Jacobs provided verbal consent during today's appointment for this provider to send the handout via e-mail. ***

## 2020-06-01 LAB — CBC WITH DIFFERENTIAL/PLATELET
Basophils Absolute: 0 10*3/uL (ref 0.0–0.2)
Basos: 1 %
EOS (ABSOLUTE): 0.1 10*3/uL (ref 0.0–0.4)
Eos: 2 %
Hematocrit: 42.6 % (ref 34.0–46.6)
Hemoglobin: 13.7 g/dL (ref 11.1–15.9)
Immature Grans (Abs): 0 10*3/uL (ref 0.0–0.1)
Immature Granulocytes: 1 %
Lymphocytes Absolute: 0.9 10*3/uL (ref 0.7–3.1)
Lymphs: 17 %
MCH: 26.7 pg (ref 26.6–33.0)
MCHC: 32.2 g/dL (ref 31.5–35.7)
MCV: 83 fL (ref 79–97)
Monocytes Absolute: 0.4 10*3/uL (ref 0.1–0.9)
Monocytes: 8 %
Neutrophils Absolute: 3.8 10*3/uL (ref 1.4–7.0)
Neutrophils: 71 %
Platelets: 208 10*3/uL (ref 150–450)
RBC: 5.14 x10E6/uL (ref 3.77–5.28)
RDW: 14.9 % (ref 11.7–15.4)
WBC: 5.3 10*3/uL (ref 3.4–10.8)

## 2020-06-01 LAB — COMPREHENSIVE METABOLIC PANEL
ALT: 15 IU/L (ref 0–32)
AST: 13 IU/L (ref 0–40)
Albumin/Globulin Ratio: 1.8 (ref 1.2–2.2)
Albumin: 4.6 g/dL (ref 3.7–4.7)
Alkaline Phosphatase: 116 IU/L (ref 44–121)
BUN/Creatinine Ratio: 20 (ref 12–28)
BUN: 16 mg/dL (ref 8–27)
Bilirubin Total: 0.4 mg/dL (ref 0.0–1.2)
CO2: 23 mmol/L (ref 20–29)
Calcium: 9.6 mg/dL (ref 8.7–10.3)
Chloride: 103 mmol/L (ref 96–106)
Creatinine, Ser: 0.8 mg/dL (ref 0.57–1.00)
Globulin, Total: 2.5 g/dL (ref 1.5–4.5)
Glucose: 94 mg/dL (ref 65–99)
Potassium: 4.4 mmol/L (ref 3.5–5.2)
Sodium: 143 mmol/L (ref 134–144)
Total Protein: 7.1 g/dL (ref 6.0–8.5)
eGFR: 75 mL/min/{1.73_m2} (ref >59)

## 2020-06-01 LAB — HEMOGLOBIN A1C
Est. average glucose Bld gHb Est-mCnc: 111 mg/dL
Hgb A1c MFr Bld: 5.5 % (ref 4.8–5.6)

## 2020-06-01 LAB — LIPID PANEL WITH LDL/HDL RATIO
Cholesterol, Total: 192 mg/dL (ref 100–199)
HDL: 46 mg/dL (ref >39)
LDL Chol Calc (NIH): 112 mg/dL — ABNORMAL HIGH (ref 0–99)
LDL/HDL Ratio: 2.4 ratio (ref 0.0–3.2)
Triglycerides: 195 mg/dL — ABNORMAL HIGH (ref 0–149)
VLDL Cholesterol Cal: 34 mg/dL (ref 5–40)

## 2020-06-01 LAB — INSULIN, RANDOM: INSULIN: 18.4 u[IU]/mL (ref 2.6–24.9)

## 2020-06-01 LAB — T3: T3, Total: 92 ng/dL (ref 71–180)

## 2020-06-01 LAB — VITAMIN D 25 HYDROXY (VIT D DEFICIENCY, FRACTURES): Vit D, 25-Hydroxy: 29.6 ng/mL — ABNORMAL LOW (ref 30.0–100.0)

## 2020-06-01 LAB — T4: T4, Total: 9.4 ug/dL (ref 4.5–12.0)

## 2020-06-01 LAB — TSH: TSH: 3.71 u[IU]/mL (ref 0.450–4.500)

## 2020-06-02 ENCOUNTER — Ambulatory Visit (INDEPENDENT_AMBULATORY_CARE_PROVIDER_SITE_OTHER): Payer: MEDICARE | Admitting: Orthopaedic Surgery

## 2020-06-02 ENCOUNTER — Encounter: Payer: Self-pay | Admitting: Orthopaedic Surgery

## 2020-06-02 DIAGNOSIS — M25561 Pain in right knee: Secondary | ICD-10-CM | POA: Diagnosis not present

## 2020-06-02 DIAGNOSIS — M1712 Unilateral primary osteoarthritis, left knee: Secondary | ICD-10-CM | POA: Diagnosis not present

## 2020-06-02 DIAGNOSIS — G8929 Other chronic pain: Secondary | ICD-10-CM

## 2020-06-02 MED ORDER — LIDOCAINE HCL 1 % IJ SOLN
3.0000 mL | INTRAMUSCULAR | Status: AC | PRN
  Administered 2020-06-02: 3 mL

## 2020-06-02 MED ORDER — METHYLPREDNISOLONE ACETATE 40 MG/ML IJ SUSP
40.0000 mg | INTRAMUSCULAR | Status: AC | PRN
  Administered 2020-06-02: 40 mg via INTRA_ARTICULAR

## 2020-06-02 NOTE — Progress Notes (Signed)
Office Visit Note   Patient: Toni Jacobs           Date of Birth: January 11, 1941           MRN: [id number] Visit Date: 06/02/2020              Requested by: Josetta Huddle, MD [address] [place],  [place] [address] PCP: Josetta Huddle, MD   Assessment & Plan: Visit Diagnoses:  1. Unilateral primary osteoarthritis, left knee   2. Chronic pain of right knee     Plan: I agree with her trying a steroid injection in her right knee today.  Having had these before and her other knee she is fully aware of the risk and benefits of steroid injections.  She did tolerate the injection well in her right knee.  Follow-up as needed.  She understands that if this knee does not get better she will come back and see [place] and we would x-ray it and then go from there.  Follow-Up Instructions: Return if symptoms worsen or fail to improve.   Orders:  Orders Placed This Encounter  Procedures  . Large Joint Inj   No orders of the defined types were placed in this encounter.     Procedures: Large Joint Inj: R knee on 06/02/2020 1:03 PM Indications: diagnostic evaluation and pain Details: 22 G 1.5 in needle, superolateral approach  Arthrogram: No  Medications: 3 mL lidocaine 1 %; 40 mg methylPREDNISolone acetate 40 MG/ML Outcome: tolerated well, no immediate complications Procedure, treatment alternatives, risks and benefits explained, specific risks discussed. Consent was given by the patient. Immediately prior to procedure a time out was called to verify the correct patient, procedure, equipment, support staff and site/side marked as required. Patient was prepped and draped in the usual sterile fashion.       Clinical Data: No additional findings.   Subjective: Chief Complaint  Patient presents with  . Left Knee - Follow-up  The patient is following up at about 10 months after a Synvisc 1 injection to treat the osteoarthritis in her left knee.  She says the knee is doing  much better overall.  She is an active 80 year old female.  She says her right knee started to bother her now.  She is not had an injection before in the right knee or x-rays.  She is getting ready to go to the beach so she will be walking up a lot of stairs as well.  She is requesting at least a steroid injection in the right knee today.  She denies any locking catching with that knee but is just been sore to her.  She has had no other acute change in medical status.  HPI  Review of Systems There is currently listed no headache, chest pain, shortness of breath, fever, chills, nausea, vomiting  Objective: Vital Signs: There were no vitals taken for this visit.  Physical Exam She is alert and orient x3 and in no acute distress Ortho Exam Examination of her left knee shows a subnormal knee today on exam with no swelling and no pain and good range of motion.  Her right knee is slightly warm with medial joint line tenderness but no ligamentous instability and good range of motion that is just painful. Specialty Comments:  No specialty comments available.  Imaging: No results found.   PMFS History: Patient Active Problem List   Diagnosis Date Noted  . Carpal tunnel syndrome, 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  . 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 Lax, ligament 08/28/2011  . VFD (visual field defect) 08/28/2011  . HYPOTHYROIDISM 04/01/2009  . HYPERTENSION 04/01/2009  . COUGH 04/01/2009  . DIVERTICULITIS, HX OF 04/01/2009   Past Medical History:  Diagnosis Date  . Anxiety   . Bilateral swelling of feet and ankles   . Carpal tunnel syndrome, 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  . Chest pain syndrome   . Diarrhea   . Diverticulitis   . Fatty liver   . GERD (gastroesophageal reflux disease)   . Hypercholesteremia   . Hypertension   . Hypothyroidism   . Impacted ear wax   . Joint pain   . Obesity   . Osteoarthritis   . Other fatigue   . Palpitations   . Shortness of breath   .  Shortness of breath on exertion   . Situational stress   . Sleep apnea    no CPAP  . Spasm   . Thyroid disease   . Tubular adenoma of colon   . Vitamin D deficiency     Family History  Problem Relation Age of Onset  . Heart disease Mother   . Hyperlipidemia Mother   . Heart disease Father   . Hypertension Father   . Sudden death Father   . Sleep apnea Father   . Obesity Father   . Hypertension Brother   . Heart disease Brother   . Diabetes Brother   . Breast cancer Neg Hx   . Colon cancer Neg Hx   . Esophageal cancer Neg Hx   . Rectal cancer Neg Hx   . Stomach cancer Neg Hx     Past Surgical History:  Procedure Laterality Date  . ABDOMINAL HYSTERECTOMY  1986  . APPENDECTOMY    . CATARACT EXTRACTION W/ INTRAOCULAR LENS IMPLANT Bilateral 07/21/15, 09/22/15  . COLONOSCOPY    . TOE SURGERY    . Unilateral Ooophorectomy     Social History   Occupational History    Employer: RETIRED  Tobacco Use  . Smoking status: Never Smoker  . Smokeless tobacco: Never Used  Vaping Use  . Vaping Use: Never used  Substance and Sexual Activity  . Alcohol use: Yes    Alcohol/week: 1.0 standard drink    Types: 1 Glasses of wine per week    Comment: occ  . Drug use: Never  . Sexual activity: Not Currently
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
## 2020-06-08 NOTE — Progress Notes (Signed)
Chief Complaint:   OBESITY Toni Jacobs (MR# [id number]) is a 80 y.o. female who presents for evaluation and treatment of obesity and related comorbidities. Current BMI is Body mass index is 33.48 kg/m. Toni Jacobs has been struggling with her weight for many years and has been unsuccessful in either losing weight, maintaining weight loss, or reaching her healthy weight goal.  Toni Jacobs is currently in the action stage of change and ready to dedicate time achieving and maintaining a healthier weight. Toni Jacobs is interested in becoming our patient and working on intensive lifestyle modifications including (but not limited to) diet and exercise for weight loss.  Toni Jacobs's habits were reviewed today and are as follows: Her family eats meals together, she thinks her family will eat healthier with her, her desired weight loss is 39 lbs, she started gaining weight between age [age] years old, her heaviest weight ever was 215 pounds, she has significant food cravings issues, she wakes up frequently in the middle of the night to eat, she skips meals frequently, she has problems with excessive hunger and she struggles with emotional eating.  Depression Screen Toni Jacobs's Food and Mood (modified PHQ-9) score was 27.  Depression screen [facility] 2/9 05/31/2020  Decreased Interest 3  Down, Depressed, Hopeless 3  PHQ - 2 Score 6  Altered sleeping 3  Tired, decreased energy 3  Change in appetite 3  Feeling bad or failure about yourself  3  Trouble concentrating 3  Moving slowly or fidgety/restless 3  Suicidal thoughts 3  PHQ-9 Score 27  Difficult doing work/chores Somewhat difficult   Subjective:   1. Other fatigue Toni Jacobs admits to daytime somnolence and denies waking up still tired. Patent has a history of symptoms of daytime fatigue. Toni Jacobs generally gets 7 or 8 hours of sleep per night, and states that she has generally restful sleep with sleep medication. Snoring is present. Apneic episodes are present.  Epworth Sleepiness Score is 10.  2. Shortness of breath on exertion Toni Jacobs notes increasing shortness of breath with exercising and seems to be worsening over time with weight gain. She notes getting out of breath sooner with activity than she used to. This has not gotten worse recently. Toni Jacobs denies shortness of breath at rest or orthopnea.  3. Hypothyroidism, unspecified type Toni Jacobs is on Synthroid, and she has no recent labs in [place]. She notes fatigue.  4. Anxiety Toni Jacobs has anxiety with a very high depression risk score. She has self-esteem issues tied to her weight, and some pressure from her husband to lose weight.  5. Primary hypertension Toni Jacobs's blood pressure is stable on medications. She states her primary care physician feels her blood pressure will improve with weight loss.  6. Hyperlipidemia, unspecified hyperlipidemia type Toni Jacobs is working on diet and exercise to help control her cholesterol. She denies chest pain.  7. Vitamin D deficiency Toni Jacobs is at high risk of Vit D deficiency, and she notes fatigue.  8. B12 deficiency Toni Jacobs has been on B12 injections in the past. She has no recent labs, and she notes fatigue.  9. Hyperglycemia Toni Jacobs has a history of some elevated glucose readings. She has no history of diabetes mellitus.  Assessment/Plan:   1. Other fatigue Toni Jacobs does feel that her weight is causing her energy to be lower than it should be. Fatigue may be related to obesity, depression or many other causes. Labs will be ordered, and in the meanwhile, Toni Jacobs will focus on self care including making healthy food choices, increasing  physical activity and focusing on stress reduction.  - Hemoglobin A1c - EKG 12-Lead - Comprehensive metabolic panel - CBC with Differential/Platelet - Insulin, random  2. Shortness of breath on exertion Toni Jacobs does feel that she gets out of breath more easily that she used to when she exercises. Toni Jacobs's shortness of breath appears to be  obesity related and exercise induced. She has agreed to work on weight loss and gradually increase exercise to treat her exercise induced shortness of breath. Will continue to monitor closely.  3. Hypothyroidism, unspecified type We will check labs today. Toni Jacobs will continue to follow up as directed. Orders and follow up as documented in patient record.  - T3 - T4 - TSH  4. Anxiety Behavior modification techniques were discussed today to help Toni Jacobs deal with her anxiety. We will refer Toni Jacobs to Dr. Mallie Mussel, our Bariatric Psychologist for evaluation. Orders and follow up as documented in patient record.   5. Primary hypertension Toni Jacobs will start her Category 1 plan, and will continue working on healthy weight loss and exercise to improve blood pressure control. We will watch for signs of hypotension as she continues her lifestyle modifications.  6. Hyperlipidemia, unspecified hyperlipidemia type Cardiovascular risk and specific lipid/LDL goals reviewed. We discussed several lifestyle modifications today. Toni Jacobs will start her Category 1 plan, and will continue to work on diet, exercise and weight loss efforts. Orders and follow up as documented in patient record.   - Lipid Panel With LDL/HDL Ratio  7. Vitamin D deficiency Low Vitamin D level contributes to fatigue and are associated with obesity, breast, and colon cancer. We will check labs today. Toni Jacobs will follow-up for routine testing of Vitamin D, at least 2-3 times per year to avoid over-replacement.  - VITAMIN D 25 Hydroxy (Vit-D Deficiency, Fractures)  8. B12 deficiency The diagnosis was reviewed with the patient. We will check labs today. Toni Jacobs will continue to follow up as directed. Orders and follow up as documented in patient record.  - Vitamin B12  9. Hyperglycemia Fasting labs will be obtained today, and results with be discussed with Toni Jacobs in 2 weeks at her follow up visit. In the meanwhile Toni Jacobs will start on her Category 1  plan and will work on weight loss efforts.  - Hemoglobin A1c - Insulin, random  10. Screening for depression Toni Jacobs had a positive depression screening. Depression is commonly associated with obesity and often results in emotional eating behaviors. We will monitor this closely and work on CBT to help improve the non-hunger eating patterns. Referral to Psychology may be required if no improvement is seen as she continues in our clinic.  11. Class 1 obesity with serious comorbidity and body mass index (BMI) of 33.0 to 33.9 in adult, unspecified obesity type Toni Jacobs is currently in the action stage of change and her goal is to continue with weight loss efforts. I recommend Toni Jacobs begin the structured treatment plan as follows:  She has agreed to the Category 1 Plan.  Exercise goals: No exercise has been prescribed for now, while we concentrate on nutritional changes.   Behavioral modification strategies: increasing lean protein intake, no skipping meals and meal planning and cooking strategies.  She was informed of the importance of frequent follow-up visits to maximize her success with intensive lifestyle modifications for her multiple health conditions. She was informed we would discuss her lab results at her next visit unless there is a critical issue that needs to be addressed sooner. Toni Jacobs agreed to keep her next  visit at the agreed upon time to discuss these results.  Objective:   Blood pressure 131/82, pulse 63, temperature 97.6 F (36.4 C), height 5\' 3"  (1.6 m), weight 189 lb (85.7 kg), SpO2 96 %. Body mass index is 33.48 kg/m.  EKG: Normal sinus rhythm, rate 68 BPM.  Indirect Calorimeter completed today shows a VO2 of 213 and a REE of 1482.  Her calculated basal metabolic rate is 6283 thus her basal metabolic rate is better than expected.  General: Cooperative, alert, well developed, in no acute distress. HEENT: Conjunctivae and lids unremarkable. Cardiovascular: Regular rhythm.   Lungs: Normal work of breathing. Neurologic: No focal deficits.   Lab Results  Component Value Date   CREATININE 0.80 05/31/2020   BUN 16 05/31/2020   NA 143 05/31/2020   K 4.4 05/31/2020   CL 103 05/31/2020   CO2 23 05/31/2020   Lab Results  Component Value Date   ALT 15 05/31/2020   AST 13 05/31/2020   ALKPHOS 116 05/31/2020   BILITOT 0.4 05/31/2020   Lab Results  Component Value Date   HGBA1C 5.5 05/31/2020   Lab Results  Component Value Date   INSULIN 18.4 05/31/2020   Lab Results  Component Value Date   TSH 3.710 05/31/2020   Lab Results  Component Value Date   CHOL 192 05/31/2020   HDL 46 05/31/2020   LDLCALC 112 (H) 05/31/2020   TRIG 195 (H) 05/31/2020   Lab Results  Component Value Date   WBC 5.3 05/31/2020   HGB 13.7 05/31/2020   HCT 42.6 05/31/2020   MCV 83 05/31/2020   PLT 208 05/31/2020   No results found for: IRON, TIBC, FERRITIN Obesity Behavioral Intervention:   Approximately 15 minutes were spent on the discussion below.  ASK: We discussed the diagnosis of obesity with Toni Jacobs today and Cherri agreed to give [place] permission to discuss obesity behavioral modification therapy today.  ASSESS: Ziara has the diagnosis of obesity and her BMI today is 33.49. Kimie is in the action stage of change.   ADVISE: Tamya was educated on the multiple health risks of obesity as well as the benefit of weight loss to improve her health. She was advised of the need for long term treatment and the importance of lifestyle modifications to improve her current health and to decrease her risk of future health problems.  AGREE: Multiple dietary modification options and treatment options were discussed and Rishika agreed to follow the recommendations documented in the above note.  ARRANGE: Maleyah was educated on the importance of frequent visits to treat obesity as outlined per CMS and USPSTF guidelines and agreed to schedule her next follow up appointment  today.  Attestation Statements:   Reviewed by clinician on day of visit: allergies, medications, problem list, medical history, surgical history, family history, social history, and previous encounter notes.   I, Trixie Dredge, am acting as transcriptionist for Dennard Nip, MD.  I have reviewed the above documentation for accuracy and completeness, and I agree with the above. - Dennard Nip, MD

## 2020-06-13 ENCOUNTER — Telehealth (INDEPENDENT_AMBULATORY_CARE_PROVIDER_SITE_OTHER): Payer: MEDICARE | Admitting: Psychology

## 2020-06-14 ENCOUNTER — Other Ambulatory Visit: Payer: Self-pay

## 2020-06-14 ENCOUNTER — Ambulatory Visit (INDEPENDENT_AMBULATORY_CARE_PROVIDER_SITE_OTHER): Payer: MEDICARE | Admitting: Family Medicine

## 2020-06-14 VITALS — BP 135/79 | HR 63 | Temp 97.7°F | Ht 63.0 in | Wt 185.0 lb

## 2020-06-14 DIAGNOSIS — E782 Mixed hyperlipidemia: Secondary | ICD-10-CM

## 2020-06-14 DIAGNOSIS — E038 Other specified hypothyroidism: Secondary | ICD-10-CM

## 2020-06-14 DIAGNOSIS — Z6833 Body mass index (BMI) 33.0-33.9, adult: Secondary | ICD-10-CM

## 2020-06-14 DIAGNOSIS — E559 Vitamin D deficiency, unspecified: Secondary | ICD-10-CM

## 2020-06-14 DIAGNOSIS — E669 Obesity, unspecified: Secondary | ICD-10-CM

## 2020-06-14 DIAGNOSIS — E8881 Metabolic syndrome: Secondary | ICD-10-CM

## 2020-06-14 MED ORDER — VITAMIN D (ERGOCALCIFEROL) 1.25 MG (50000 UNIT) PO CAPS: 50000.0000 [IU] | ORAL_CAPSULE | ORAL | 0 refills | Status: DC

## 2020-06-16 NOTE — Progress Notes (Signed)
Chief Complaint:   OBESITY Toni Jacobs is here to discuss her progress with her obesity treatment plan along with follow-up of her obesity related diagnoses. Toni Jacobs is on the Category 1 Plan and states she is following her eating plan approximately 70-75% of the time. Toni Jacobs states she is doing Herbalist for 20-25 minutes 3 times per week.  Today's visit was #: 2 Starting weight: 189 lbs Starting date: 05/31/2020 Today's weight: 185 lbs Today's date: 06/14/2020 Total lbs lost to date: 4 Total lbs lost since last in-office visit: 4  Interim History: Toni Jacobs did well with weight loss and she tried to make better choices. She made some substitutions and she didn't eat all of her food. She tried to decrease sugar in her drinks and decrease her butter.  Subjective:   1. Mixed hyperlipidemia Toni Jacobs's LDL and triglycerides are elevated. She is not on statin, and she wants to improve with diet and weight loss. I discussed labs with the patient today.  2. Vitamin D deficiency Toni Jacobs is on OTC Vit D, and her level is not yet at goal. I discussed labs with the patient today.  3. Insulin resistance Toni Jacobs has a new diagnosis of insulin resistance. Her glucose and A1c are within normal limits, but her fasting insulin is elevated. I discussed labs with the patient today.  4. Other specified hypothyroidism Toni Jacobs is on levothyroxine at 137 mcg, and her thyroid labs are all within normal limits. I discussed labs with the patient today.  Assessment/Plan:   1. Mixed hyperlipidemia Cardiovascular risk and specific lipid/LDL goals reviewed. We discussed several lifestyle modifications today. Toni Jacobs will continue to work on diet, decrease saturated fats, and weight loss efforts. Orders and follow up as documented in patient record.   2. Vitamin D deficiency Low Vitamin D level contributes to fatigue and are associated with obesity, breast, and colon cancer. Toni Jacobs agreed to start prescription Vitamin D 50,000 IU  every week with no refills. She will follow-up for routine testing of Vitamin D, at least 2-3 times per year to avoid over-replacement.  - Vitamin D, Ergocalciferol, (DRISDOL) 1.25 MG (50000 UNIT) CAPS capsule; Take 1 capsule (50,000 Units total) by mouth every 7 (seven) days.  Dispense: 4 capsule; Refill: 0  3. Insulin resistance Toni Jacobs will continue to work on weight loss, exercise, and decreasing simple carbohydrates to help decrease the risk of diabetes. We will recheck labs in 3 months. Toni Jacobs agreed to follow-up with [place] as directed to closely monitor her progress.  4. Other specified hypothyroidism Toni Jacobs will continue levothyroxine at 137 mcg, and will continue to follow up as directed. Orders and follow up as documented in patient record.  5. Obesity with current BMI of 32.8 Toni Jacobs is currently in the action stage of change. As such, her goal is to continue with weight loss efforts. She has agreed to practicing portion control and making smarter food choices, such as increasing vegetables and decreasing simple carbohydrates.   Exercise goals: As is.  Behavioral modification strategies: increasing lean protein intake and decreasing simple carbohydrates.  Toni Jacobs has agreed to follow-up with our clinic in 2 weeks. She was informed of the importance of frequent follow-up visits to maximize her success with intensive lifestyle modifications for her multiple health conditions.   Objective:   Blood pressure 135/79, pulse 63, temperature 97.7 F (36.5 C), height 5\' 3"  (1.6 m), weight 185 lb (83.9 kg), SpO2 97 %. Body mass index is 32.77 kg/m.  General: Cooperative, alert, well developed, in  no acute distress. HEENT: Conjunctivae and lids unremarkable. Cardiovascular: Regular rhythm.  Lungs: Normal work of breathing. Neurologic: No focal deficits.   Lab Results  Component Value Date   CREATININE 0.80 05/31/2020   BUN 16 05/31/2020   NA 143 05/31/2020   K 4.4 05/31/2020   CL 103  05/31/2020   CO2 23 05/31/2020   Lab Results  Component Value Date   ALT 15 05/31/2020   AST 13 05/31/2020   ALKPHOS 116 05/31/2020   BILITOT 0.4 05/31/2020   Lab Results  Component Value Date   HGBA1C 5.5 05/31/2020   Lab Results  Component Value Date   INSULIN 18.4 05/31/2020   Lab Results  Component Value Date   TSH 3.710 05/31/2020   Lab Results  Component Value Date   CHOL 192 05/31/2020   HDL 46 05/31/2020   LDLCALC 112 (H) 05/31/2020   TRIG 195 (H) 05/31/2020   Lab Results  Component Value Date   WBC 5.3 05/31/2020   HGB 13.7 05/31/2020   HCT 42.6 05/31/2020   MCV 83 05/31/2020   PLT 208 05/31/2020   No results found for: IRON, TIBC, FERRITIN  Obesity Behavioral Intervention:   Approximately 15 minutes were spent on the discussion below.  ASK: We discussed the diagnosis of obesity with Toni Jacobs today and Toni Jacobs agreed to give [place] permission to discuss obesity behavioral modification therapy today.  ASSESS: Toni Jacobs has the diagnosis of obesity and her BMI today is 32.78. Toni Jacobs is in the action stage of change.   ADVISE: Toni Jacobs was educated on the multiple health risks of obesity as well as the benefit of weight loss to improve her health. She was advised of the need for long term treatment and the importance of lifestyle modifications to improve her current health and to decrease her risk of future health problems.  AGREE: Multiple dietary modification options and treatment options were discussed and Toni Jacobs agreed to follow the recommendations documented in the above note.  ARRANGE: Toni Jacobs was educated on the importance of frequent visits to treat obesity as outlined per CMS and USPSTF guidelines and agreed to schedule her next follow up appointment today.  Attestation Statements:   Reviewed by clinician on day of visit: allergies, medications, problem list, medical history, surgical history, family history, social history, and previous encounter notes.   I,  Trixie Dredge, am acting as transcriptionist for Dennard Nip, MD.  I have reviewed the above documentation for accuracy and completeness, and I agree with the above. -  Dennard Nip, MD

## 2020-06-20 ENCOUNTER — Telehealth (INDEPENDENT_AMBULATORY_CARE_PROVIDER_SITE_OTHER): Payer: MEDICARE | Admitting: Psychology

## 2020-06-20 DIAGNOSIS — F3289 Other specified depressive episodes: Secondary | ICD-10-CM

## 2020-06-21 ENCOUNTER — Encounter (INDEPENDENT_AMBULATORY_CARE_PROVIDER_SITE_OTHER): Payer: Self-pay | Admitting: Family Medicine

## 2020-06-29 ENCOUNTER — Ambulatory Visit (INDEPENDENT_AMBULATORY_CARE_PROVIDER_SITE_OTHER): Payer: MEDICARE | Admitting: Adult Health

## 2020-06-29 ENCOUNTER — Encounter (INDEPENDENT_AMBULATORY_CARE_PROVIDER_SITE_OTHER): Payer: Self-pay | Admitting: Adult Health

## 2020-06-29 ENCOUNTER — Other Ambulatory Visit: Payer: Self-pay

## 2020-06-29 VITALS — BP 140/84 | HR 62 | Temp 97.8°F | Ht 63.0 in | Wt 181.0 lb

## 2020-06-29 DIAGNOSIS — I10 Essential (primary) hypertension: Secondary | ICD-10-CM

## 2020-06-29 DIAGNOSIS — E559 Vitamin D deficiency, unspecified: Secondary | ICD-10-CM

## 2020-06-29 DIAGNOSIS — E669 Obesity, unspecified: Secondary | ICD-10-CM

## 2020-06-29 DIAGNOSIS — Z6833 Body mass index (BMI) 33.0-33.9, adult: Secondary | ICD-10-CM

## 2020-06-29 MED ORDER — VITAMIN D (ERGOCALCIFEROL) 1.25 MG (50000 UNIT) PO CAPS
50000.0000 [IU] | ORAL_CAPSULE | ORAL | 0 refills | Status: DC
Start: 2020-06-29 — End: 2020-08-02

## 2020-06-30 DIAGNOSIS — E559 Vitamin D deficiency, unspecified: Secondary | ICD-10-CM | POA: Insufficient documentation

## 2020-06-30 NOTE — Progress Notes (Signed)
Chief Complaint:   OBESITY Toni Jacobs is here to discuss her progress with her obesity treatment plan along with follow-up of her obesity related diagnoses. Toni Jacobs is on practicing portion control and making smarter food choices, such as increasing vegetables and decreasing simple carbohydrates and states she is following her eating plan approximately 70-75% of the time. Toni Jacobs states she is not currently exercising.  Today's visit was #: 3 Starting weight: 189 lbs Starting date: 05/31/2020 Today's weight: 181 lbs Today's date: 06/29/2020 Total lbs lost to date: 8 Total lbs lost since last in-office visit: 4  Interim History: Toni Jacobs has been following "healthy eating" of 3 meals a day with protein.  A list of healthy foods was given to her at last OV.  She has been choosing lower cal foods.  She has been using "Kiss My Keto" gummies to decrease carb cravings.  Of Note: She started, "I take a pee pill". No diuretic is listed on her medication list.  Subjective:   1. Vitamin D deficiency Toni Jacobs's Vitamin D level was 29.6 on 05/31/2020, which is well below goal of 50. She is currently taking prescription vitamin D 50,000 IU each week. She denies nausea, vomiting or muscle weakness. She is not on any additional OTC Vit D supplements.  2. Primary hypertension BP/HR stable at OV. Toni Jacobs is on amlodipine 2.5 mg QD and Avapro 300 mg QD. She denies chest pain, dyspnea, and palpitations.  Pt reports taking a diuretic, however this Rx is not listed on current medication list.  BP Readings from Last 3 Encounters:  06/29/20 140/84  06/14/20 135/79  05/31/20 131/82    Assessment/Plan:   1. Vitamin D deficiency Low Vitamin D level contributes to fatigue and are associated with obesity, breast, and colon cancer. She agrees to continue to take prescription Vitamin D @50 ,000 IU every week and will follow-up for routine testing of Vitamin D, at least 2-3 times per year to avoid over-replacement. -  Vitamin D, Ergocalciferol, (DRISDOL) 1.25 MG (50000 UNIT) CAPS capsule; Take 1 capsule (50,000 Units total) by mouth every 7 (seven) days.  Dispense: 4 capsule; Refill: 0  2. Primary hypertension Toni Jacobs is working on healthy weight loss and exercise to improve blood pressure control. We will watch for signs of hypotension as she continues her lifestyle modifications. -Continue current anti-hypertensive regimen. -Bring all medicine bottles to next OV.  3. Obesity with current BMI of 32.1  Toni Jacobs is currently in the action stage of change. As such, her goal is to continue with weight loss efforts. She has agreed to the Category 1 Plan.   1. Discussed at length what condiments to avoid and what foods to include. 2. Bring all medicine bottles to next OV.  Exercise goals: No exercise has been prescribed at this time.  Behavioral modification strategies: increasing lean protein intake, decreasing simple carbohydrates, meal planning and cooking strategies and planning for success.  Toni Jacobs has agreed to follow-up with our clinic in 2 weeks. She was informed of the importance of frequent follow-up visits to maximize her success with intensive lifestyle modifications for her multiple health conditions.   Objective:   Blood pressure 140/84, pulse 62, temperature 97.8 F (36.6 C), height 5\' 3"  (1.6 m), weight 181 lb (82.1 kg), SpO2 98 %. Body mass index is 32.06 kg/m.  General: Cooperative, alert, well developed, in no acute distress. HEENT: Conjunctivae and lids unremarkable. Cardiovascular: Regular rhythm.  Lungs: Normal work of breathing. Neurologic: No focal deficits.   Lab Results  Component Value Date   CREATININE 0.80 05/31/2020   BUN 16 05/31/2020   NA 143 05/31/2020   K 4.4 05/31/2020   CL 103 05/31/2020   CO2 23 05/31/2020   Lab Results  Component Value Date   ALT 15 05/31/2020   AST 13 05/31/2020   ALKPHOS 116 05/31/2020   BILITOT 0.4 05/31/2020   Lab Results  Component  Value Date   HGBA1C 5.5 05/31/2020   Lab Results  Component Value Date   INSULIN 18.4 05/31/2020   Lab Results  Component Value Date   TSH 3.710 05/31/2020   Lab Results  Component Value Date   CHOL 192 05/31/2020   HDL 46 05/31/2020   LDLCALC 112 (H) 05/31/2020   TRIG 195 (H) 05/31/2020   Lab Results  Component Value Date   WBC 5.3 05/31/2020   HGB 13.7 05/31/2020   HCT 42.6 05/31/2020   MCV 83 05/31/2020   PLT 208 05/31/2020   No results found for: IRON, TIBC, FERRITIN  Obesity Behavioral Intervention:   Approximately 15 minutes were spent on the discussion below.  ASK: We discussed the diagnosis of obesity with Vaughan Basta today and Anaysha agreed to give [place] permission to discuss obesity behavioral modification therapy today.  ASSESS: Aleena has the diagnosis of obesity and her BMI today is 32.1. Christine is in the action stage of change.   ADVISE: Devorah was educated on the multiple health risks of obesity as well as the benefit of weight loss to improve her health. She was advised of the need for long term treatment and the importance of lifestyle modifications to improve her current health and to decrease her risk of future health problems.  AGREE: Multiple dietary modification options and treatment options were discussed and Imaan agreed to follow the recommendations documented in the above note.  ARRANGE: Matina was educated on the importance of frequent visits to treat obesity as outlined per CMS and USPSTF guidelines and agreed to schedule her next follow up appointment today.  Attestation Statements:   Reviewed by clinician on day of visit: allergies, medications, problem list, medical history, surgical history, family history, social history, and previous encounter notes.  Coral Ceo, CMA, am acting as transcriptionist for Mina Marble, NP.  I have reviewed the above documentation for accuracy and completeness, and I agree with the above. -  Farra Nikolic d. Keelie Zemanek,  NP-C

## 2020-07-12 ENCOUNTER — Ambulatory Visit (INDEPENDENT_AMBULATORY_CARE_PROVIDER_SITE_OTHER): Payer: MEDICARE | Admitting: Family Medicine

## 2020-07-12 ENCOUNTER — Other Ambulatory Visit: Payer: Self-pay

## 2020-07-12 ENCOUNTER — Encounter (INDEPENDENT_AMBULATORY_CARE_PROVIDER_SITE_OTHER): Payer: Self-pay | Admitting: Family Medicine

## 2020-07-12 VITALS — BP 122/79 | HR 92 | Temp 97.4°F | Ht 63.0 in | Wt 181.0 lb

## 2020-07-12 DIAGNOSIS — Z6833 Body mass index (BMI) 33.0-33.9, adult: Secondary | ICD-10-CM

## 2020-07-12 DIAGNOSIS — E669 Obesity, unspecified: Secondary | ICD-10-CM

## 2020-07-12 DIAGNOSIS — I10 Essential (primary) hypertension: Secondary | ICD-10-CM

## 2020-07-14 NOTE — Progress Notes (Signed)
Chief Complaint:   OBESITY Toni Jacobs is here to discuss her progress with her obesity treatment plan along with follow-up of her obesity related diagnoses. Toni Jacobs is on the Category 1 Plan and states she is following her eating plan approximately 60-65% of the time. Toni Jacobs states she is walking for 20 minutes 3 times per week.  Today's visit was #: 4 Starting weight: 189 lbs Starting date: 05/31/2020 Today's weight: 181 lbs Today's date: 07/12/2020 Total lbs lost to date: 8 Total lbs lost since last in-office visit: 0  Interim History: Toni Jacobs has been on vacation and traveling. She remained mindful of her food choices and tried to increase vegetables and lean protein. She notes her hunger is mostly controlled.  Subjective:   1. Essential hypertension Toni Jacobs's blood pressure is well controlled today. It has been improving since she has been working on diet and weight loss without signs of hypotension. No change in her medications.  Assessment/Plan:   1. Essential hypertension Toni Jacobs will continue with diet and exercise to improve blood pressure control. We will watch for signs that she needs to decrease her blood pressure medications.  2. Obesity with current BMI 32.1 Toni Jacobs is currently in the action stage of change. As such, her goal is to continue with weight loss efforts. She has agreed to the Category 1 Plan.   Easy options to help keep her meals interesting were discussed today.  Exercise goals: As is.  Behavioral modification strategies: increasing lean protein intake and meal planning and cooking strategies.  Toni Jacobs has agreed to follow-up with our clinic in 2 to 3 weeks. She was informed of the importance of frequent follow-up visits to maximize her success with intensive lifestyle modifications for her multiple health conditions.   Objective:   Blood pressure 122/79, pulse 92, temperature (!) 97.4 F (36.3 C), height 5\' 3"  (1.6 m), weight 181 lb (82.1 kg), SpO2 98 %. Body  mass index is 32.06 kg/m.  General: Cooperative, alert, well developed, in no acute distress. HEENT: Conjunctivae and lids unremarkable. Cardiovascular: Regular rhythm.  Lungs: Normal work of breathing. Neurologic: No focal deficits.   Lab Results  Component Value Date   CREATININE 0.80 05/31/2020   BUN 16 05/31/2020   NA 143 05/31/2020   K 4.4 05/31/2020   CL 103 05/31/2020   CO2 23 05/31/2020   Lab Results  Component Value Date   ALT 15 05/31/2020   AST 13 05/31/2020   ALKPHOS 116 05/31/2020   BILITOT 0.4 05/31/2020   Lab Results  Component Value Date   HGBA1C 5.5 05/31/2020   Lab Results  Component Value Date   INSULIN 18.4 05/31/2020   Lab Results  Component Value Date   TSH 3.710 05/31/2020   Lab Results  Component Value Date   CHOL 192 05/31/2020   HDL 46 05/31/2020   LDLCALC 112 (H) 05/31/2020   TRIG 195 (H) 05/31/2020   Lab Results  Component Value Date   WBC 5.3 05/31/2020   HGB 13.7 05/31/2020   HCT 42.6 05/31/2020   MCV 83 05/31/2020   PLT 208 05/31/2020   No results found for: IRON, TIBC, FERRITIN  Attestation Statements:   Reviewed by clinician on day of visit: allergies, medications, problem list, medical history, surgical history, family history, social history, and previous encounter notes.  Time spent on visit including pre-visit chart review and post-visit care and charting was 30 minutes.    [name], am acting as transcriptionist for Dennard Nip, MD.  I have reviewed the above documentation for accuracy and completeness, and I agree with the above. -  Dennard Nip, MD

## 2020-07-19 ENCOUNTER — Telehealth (INDEPENDENT_AMBULATORY_CARE_PROVIDER_SITE_OTHER): Payer: Self-pay | Admitting: Psychology

## 2020-08-02 ENCOUNTER — Encounter (INDEPENDENT_AMBULATORY_CARE_PROVIDER_SITE_OTHER): Payer: Self-pay | Admitting: Family Medicine

## 2020-08-02 ENCOUNTER — Other Ambulatory Visit: Payer: Self-pay

## 2020-08-02 ENCOUNTER — Ambulatory Visit (INDEPENDENT_AMBULATORY_CARE_PROVIDER_SITE_OTHER): Payer: MEDICARE | Admitting: Family Medicine

## 2020-08-02 VITALS — BP 126/82 | HR 66 | Temp 97.7°F | Ht 63.0 in | Wt 176.0 lb

## 2020-08-02 DIAGNOSIS — G2581 Restless legs syndrome: Secondary | ICD-10-CM

## 2020-08-02 DIAGNOSIS — Z6833 Body mass index (BMI) 33.0-33.9, adult: Secondary | ICD-10-CM

## 2020-08-02 DIAGNOSIS — E669 Obesity, unspecified: Secondary | ICD-10-CM

## 2020-08-02 DIAGNOSIS — E559 Vitamin D deficiency, unspecified: Secondary | ICD-10-CM

## 2020-08-02 MED ORDER — VITAMIN D (ERGOCALCIFEROL) 1.25 MG (50000 UNIT) PO CAPS
50000.0000 [IU] | ORAL_CAPSULE | ORAL | 0 refills | Status: DC
Start: 2020-08-02 — End: 2020-08-25

## 2020-08-04 DIAGNOSIS — Z1231 Encounter for screening mammogram for malignant neoplasm of breast: Secondary | ICD-10-CM | POA: Diagnosis not present

## 2020-08-08 NOTE — Progress Notes (Signed)
Chief Complaint:   OBESITY Toni Jacobs is here to discuss her progress with her obesity treatment plan along with follow-up of her obesity related diagnoses. Toni Jacobs is on the Category 1 Plan and states she is following her eating plan approximately 60% of the time. Toni Jacobs states she is doing home arm exercise videos for 20 minutes 2 times per week.  Today's visit was #: 5 Starting weight: 189 lbs Starting date: 05/31/2020 Today's weight: 176 lbs Today's date: 08/02/2020 Total lbs lost to date: 13 Total lbs lost since last in-office visit: 5  Interim History: Toni Jacobs continues to do well with weight loss on her eating plan. She is trying to follow her plan. She feels she may be retaining some fluid today, but she hasn't been drinking much water.  Subjective:   1. Vitamin D deficiency Toni Jacobs is stable on Vit D prescription. Last labs were not yet at goal.  2. RLS (restless legs syndrome) Toni Jacobs feels her legs "jumps" around a lot at bedtime which makes falling asleep difficult. She tries to take mustard and pickle juice and OTC restless leg syndrome medications without results.  Assessment/Plan:   1. Vitamin D deficiency Low Vitamin D level contributes to fatigue and are associated with obesity, breast, and colon cancer. We will refill prescription Vitamin D for 1 month. Toni Jacobs will follow-up for routine testing of Vitamin D, at least 2-3 times per year to avoid over-replacement.  - Vitamin D, Ergocalciferol, (DRISDOL) 1.25 MG (50000 UNIT) CAPS capsule; Take 1 capsule (50,000 Units total) by mouth every 7 (seven) days.  Dispense: 4 capsule; Refill: 0  2. RLS (restless legs syndrome) We will refer Toni Jacobs to Toni Jacobs for evaluation and treatment.  - Ambulatory referral to Neurology  3. Obesity with current BMI 31.2 Toni Jacobs is currently in the action stage of change. As such, her goal is to continue with weight loss efforts. She has agreed to the Category 1 Plan.   Exercise goals: As  is.  Behavioral modification strategies: increasing lean protein intake and increasing water intake.  Toni Jacobs has agreed to follow-up with our clinic in 2 to 3 weeks. She was informed of the importance of frequent follow-up visits to maximize her success with intensive lifestyle modifications for her multiple health conditions.   Objective:   Blood pressure 126/82, pulse 66, temperature 97.7 F (36.5 C), height 5\' 3"  (1.6 m), weight 176 lb (79.8 kg), SpO2 98 %. Body mass index is 31.18 kg/m.  General: Cooperative, alert, well developed, in no acute distress. HEENT: Conjunctivae and lids unremarkable. Cardiovascular: Regular rhythm.  Lungs: Normal work of breathing. Neurologic: No focal deficits.   Lab Results  Component Value Date   CREATININE 0.80 05/31/2020   BUN 16 05/31/2020   NA 143 05/31/2020   K 4.4 05/31/2020   CL 103 05/31/2020   CO2 23 05/31/2020   Lab Results  Component Value Date   ALT 15 05/31/2020   AST 13 05/31/2020   ALKPHOS 116 05/31/2020   BILITOT 0.4 05/31/2020   Lab Results  Component Value Date   HGBA1C 5.5 05/31/2020   Lab Results  Component Value Date   INSULIN 18.4 05/31/2020   Lab Results  Component Value Date   TSH 3.710 05/31/2020   Lab Results  Component Value Date   CHOL 192 05/31/2020   HDL 46 05/31/2020   LDLCALC 112 (H) 05/31/2020   TRIG 195 (H) 05/31/2020   Lab Results  Component Value Date   WBC 5.3 05/31/2020  HGB 13.7 05/31/2020   HCT 42.6 05/31/2020   MCV 83 05/31/2020   PLT 208 05/31/2020   No results found for: IRON, TIBC, FERRITIN  Obesity Behavioral Intervention:   Approximately 15 minutes were spent on the discussion below.  ASK: We discussed the diagnosis of obesity with Toni Jacobs today and [name] agreed to give [place] permission to discuss obesity behavioral modification therapy today.  ASSESS: [name] has the diagnosis of obesity and her BMI today is 31.18. [name] is in the action stage of change.    ADVISE: [name] was educated on the multiple health risks of obesity as well as the benefit of weight loss to improve her health. She was advised of the need for long term treatment and the importance of lifestyle modifications to improve her current health and to decrease her risk of future health problems.  AGREE: Multiple dietary modification options and treatment options were discussed and [name] agreed to follow the recommendations documented in the above note.  ARRANGE: [name] was educated on the importance of frequent visits to treat obesity as outlined per CMS and USPSTF guidelines and agreed to schedule her next follow up appointment today.  Attestation Statements:   Reviewed by clinician on day of visit: allergies, medications, problem list, medical history, surgical history, family history, social history, and previous encounter notes.   I, Trixie Dredge, am acting as transcriptionist for Dennard Nip, MD.  I have reviewed the above documentation for accuracy and completeness, and I agree with the above. -  Dennard Nip, MD

## 2020-08-23 ENCOUNTER — Ambulatory Visit (INDEPENDENT_AMBULATORY_CARE_PROVIDER_SITE_OTHER): Payer: MEDICARE | Admitting: Physician Assistant

## 2020-08-25 ENCOUNTER — Other Ambulatory Visit: Payer: Self-pay

## 2020-08-25 ENCOUNTER — Encounter (INDEPENDENT_AMBULATORY_CARE_PROVIDER_SITE_OTHER): Payer: Self-pay | Admitting: Physician Assistant

## 2020-08-25 ENCOUNTER — Ambulatory Visit (INDEPENDENT_AMBULATORY_CARE_PROVIDER_SITE_OTHER): Payer: MEDICARE | Admitting: Physician Assistant

## 2020-08-25 VITALS — BP 134/81 | HR 64 | Temp 97.6°F | Ht 63.0 in | Wt 172.0 lb

## 2020-08-25 DIAGNOSIS — E669 Obesity, unspecified: Secondary | ICD-10-CM

## 2020-08-25 DIAGNOSIS — E559 Vitamin D deficiency, unspecified: Secondary | ICD-10-CM

## 2020-08-25 DIAGNOSIS — Z6833 Body mass index (BMI) 33.0-33.9, adult: Secondary | ICD-10-CM

## 2020-08-25 MED ORDER — VITAMIN D (ERGOCALCIFEROL) 1.25 MG (50000 UNIT) PO CAPS: 50000.0000 [IU] | ORAL_CAPSULE | ORAL | 0 refills | Status: DC

## 2020-09-01 NOTE — Progress Notes (Signed)
Chief Complaint:   OBESITY Toni Jacobs is here to discuss her progress with her obesity treatment plan along with follow-up of her obesity related diagnoses. Toni Jacobs is on the Category 1 Plan and states she is following her eating plan approximately 70% of the time. [name] states she is [place] for 30 minutes 2 times per week.  Today's visit was #: 6 Starting weight: 189 lbs Starting date: 05/31/2020 Today's weight: 172 lbs Today's date: 08/25/2020 Total lbs lost to date: 17 Total lbs lost since last in-office visit: 4  Interim History: [name] continues to do well with weight loss. She is eating one egg for breakfast and she is trying to snack on [place] during the day. She has been eating watermelon for snacks as well.  Subjective:   1. Vitamin D deficiency Toni Jacobs is on Vit D weekly, and she denies nausea, vomiting, or muscle weakness.  Assessment/Plan:   1. Vitamin D deficiency Low Vitamin D level contributes to fatigue and are associated with obesity, breast, and colon cancer. We will refill prescription Vitamin D for 1 month. [name] will follow-up for routine testing of Vitamin D, at least 2-3 times per year to avoid over-replacement.  - Vitamin D, Ergocalciferol, (DRISDOL) 1.25 MG (50000 UNIT) CAPS capsule; Take 1 capsule (50,000 Units total) by mouth every 7 (seven) days.  Dispense: 4 capsule; Refill: 0  2. Obesity with current BMI 30.48 Toni Jacobs is currently in the action stage of change. As such, her goal is to continue with weight loss efforts. She has agreed to the Category 1 Plan.   Exercise goals: As is.  Behavioral modification strategies: increasing lean protein intake and meal planning and cooking strategies.  Toni Jacobs has agreed to follow-up with our clinic in 2 weeks. She was informed of the importance of frequent follow-up visits to maximize her success with intensive lifestyle modifications for her multiple health conditions.   Objective:   Blood pressure 134/81, pulse 64,  temperature 97.6 F (36.4 C), height 5\' 3"  (1.6 m), weight 172 lb (78 kg), SpO2 98 %. Body mass index is 30.47 kg/m.  General: Cooperative, alert, well developed, in no acute distress. HEENT: Conjunctivae and lids unremarkable. Cardiovascular: Regular rhythm.  Lungs: Normal work of breathing. Neurologic: No focal deficits.   Lab Results  Component Value Date   CREATININE 0.80 05/31/2020   BUN 16 05/31/2020   NA 143 05/31/2020   K 4.4 05/31/2020   CL 103 05/31/2020   CO2 23 05/31/2020   Lab Results  Component Value Date   ALT 15 05/31/2020   AST 13 05/31/2020   ALKPHOS 116 05/31/2020   BILITOT 0.4 05/31/2020   Lab Results  Component Value Date   HGBA1C 5.5 05/31/2020   Lab Results  Component Value Date   INSULIN 18.4 05/31/2020   Lab Results  Component Value Date   TSH 3.710 05/31/2020   Lab Results  Component Value Date   CHOL 192 05/31/2020   HDL 46 05/31/2020   LDLCALC 112 (H) 05/31/2020   TRIG 195 (H) 05/31/2020   Lab Results  Component Value Date   VD25OH 29.6 (L) 05/31/2020   Lab Results  Component Value Date   WBC 5.3 05/31/2020   HGB 13.7 05/31/2020   HCT 42.6 05/31/2020   MCV 83 05/31/2020   PLT 208 05/31/2020   No results found for: IRON, TIBC, FERRITIN  Obesity Behavioral Intervention:   Approximately 15 minutes were spent on the discussion below.  ASK: We discussed the diagnosis  of obesity with Vaughan Basta today and Elizet agreed to give [place] permission to discuss obesity behavioral modification therapy today.  ASSESS: Toni Jacobs has the diagnosis of obesity and her BMI today is 30.48. Toni Jacobs is in the action stage of change.   ADVISE: Toni Jacobs was educated on the multiple health risks of obesity as well as the benefit of weight loss to improve her health. She was advised of the need for long term treatment and the importance of lifestyle modifications to improve her current health and to decrease her risk of future health problems.  AGREE: Multiple  dietary modification options and treatment options were discussed and Toni Jacobs agreed to follow the recommendations documented in the above note.  ARRANGE: Toni Jacobs was educated on the importance of frequent visits to treat obesity as outlined per CMS and USPSTF guidelines and agreed to schedule her next follow up appointment today.  Attestation Statements:   Reviewed by clinician on day of visit: allergies, medications, problem list, medical history, surgical history, family history, social history, and previous encounter notes.   [name], am acting as transcriptionist for [employer], PA-C.  I have reviewed the above documentation for accuracy and completeness, and I agree with the above. [name], PA-C

## 2020-09-08 ENCOUNTER — Ambulatory Visit (INDEPENDENT_AMBULATORY_CARE_PROVIDER_SITE_OTHER): Payer: MEDICARE | Admitting: Bariatrics

## 2020-09-29 DIAGNOSIS — R922 Inconclusive mammogram: Secondary | ICD-10-CM | POA: Diagnosis not present

## 2020-09-29 DIAGNOSIS — R928 Other abnormal and inconclusive findings on diagnostic imaging of breast: Secondary | ICD-10-CM | POA: Diagnosis not present

## 2020-09-29 DIAGNOSIS — R921 Mammographic calcification found on diagnostic imaging of breast: Secondary | ICD-10-CM | POA: Diagnosis not present

## 2020-10-19 ENCOUNTER — Ambulatory Visit (INDEPENDENT_AMBULATORY_CARE_PROVIDER_SITE_OTHER): Payer: MEDICARE | Admitting: Orthopaedic Surgery

## 2020-10-19 ENCOUNTER — Encounter: Payer: Self-pay | Admitting: Orthopaedic Surgery

## 2020-10-19 DIAGNOSIS — M25562 Pain in left knee: Secondary | ICD-10-CM

## 2020-10-19 DIAGNOSIS — M25561 Pain in right knee: Secondary | ICD-10-CM | POA: Diagnosis not present

## 2020-10-19 DIAGNOSIS — G8929 Other chronic pain: Secondary | ICD-10-CM | POA: Diagnosis not present

## 2020-10-19 MED ORDER — LIDOCAINE HCL 1 % IJ SOLN
3.0000 mL | INTRAMUSCULAR | Status: AC | PRN
  Administered 2020-10-19: 3 mL

## 2020-10-19 MED ORDER — MELOXICAM 15 MG PO TABS: 15.0000 mg | ORAL_TABLET | Freq: Every day | ORAL | 6 refills | Status: DC | PRN

## 2020-10-19 MED ORDER — METHYLPREDNISOLONE ACETATE 40 MG/ML IJ SUSP
40.0000 mg | INTRAMUSCULAR | Status: AC | PRN
  Administered 2020-10-19: 40 mg via INTRA_ARTICULAR

## 2020-10-19 NOTE — Progress Notes (Signed)
Office Visit Note   Patient: Toni Jacobs           Date of Birth: [birth year]           MRN: [id number] Visit Date: 10/19/2020              Requested by: Josetta Huddle, MD [address] [place],  [place] [address] PCP: Josetta Huddle, MD   Assessment & Plan: Visit Diagnoses:  1. Chronic pain of left knee   2. Chronic pain of right knee     Plan: I did place a steroid injection per her request in both knees without difficulty.  She can follow-up as needed.  She knows to wait at least 3 to 4 months between injections.  All questions and concerns were answered and addressed.  I did send in some meloxicam for her.  Follow-Up Instructions: Return if symptoms worsen or fail to improve.   Orders:  Orders Placed This Encounter  Procedures   Large Joint Inj   Large Joint Inj   No orders of the defined types were placed in this encounter.     Procedures: Large Joint Inj: R knee on 10/19/2020 3:11 PM Indications: diagnostic evaluation and pain Details: 22 G 1.5 in needle, superolateral approach  Arthrogram: No  Medications: 3 mL lidocaine 1 %; 40 mg methylPREDNISolone acetate 40 MG/ML Outcome: tolerated well, no immediate complications Procedure, treatment alternatives, risks and benefits explained, specific risks discussed. Consent was given by the patient. Immediately prior to procedure a time out was called to verify the correct patient, procedure, equipment, support staff and site/side marked as required. Patient was prepped and draped in the usual sterile fashion.    Large Joint Inj: L knee on 10/19/2020 3:12 PM Indications: diagnostic evaluation and pain Details: 22 G 1.5 in needle, superolateral approach  Arthrogram: No  Medications: 3 mL lidocaine 1 %; 40 mg methylPREDNISolone acetate 40 MG/ML Outcome: tolerated well, no immediate complications Procedure, treatment alternatives, risks and benefits explained, specific risks discussed. Consent was given  by the patient. Immediately prior to procedure a time out was called to verify the correct patient, procedure, equipment, support staff and site/side marked as required. Patient was prepped and draped in the usual sterile fashion.      Clinical Data: No additional findings.   Subjective: Chief Complaint  Patient presents with   Right Knee - Follow-up   Left Knee - Follow-up  The patient is a 80 year old female well-known to me.  She has had steroid injections and hyaluronic acid injections in her knees before.  Her left knee is the more painful knee and it wakes her up at night.  It has been quite sometime since we have injected her left knee.  Her right knee we injected about 5 months ago with a steroid.  She is going on a trip next week with her husband and will have to walk a lot of stairs so she is requesting steroid injections in both knees today.  She has had no acute change in her medical status.  She would also like [place] to send in some meloxicam with refills.  HPI  Review of Systems There is currently listed no headache, chest pain, shortness of breath, fever, chills, nausea, vomiting  Objective: Vital Signs: There were no vitals taken for this visit.  Physical Exam She is alert and orient x3 and in no acute distress Ortho Exam Examination of both knees shows slight varus malalignment.  There is  patellofemoral crepitation and global tenderness of both knees but no effusion and good range of motion. Specialty Comments:  No specialty comments available.  Imaging: No results found.   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   Lax, ligament 08/28/2011   VFD (visual field defect) 08/28/2011   HYPOTHYROIDISM 04/01/2009   Primary hypertension 04/01/2009   COUGH 04/01/2009   DIVERTICULITIS, HX OF 04/01/2009    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   Chest pain syndrome    Diarrhea    Diverticulitis    Fatty liver    GERD (gastroesophageal reflux disease)    Hypercholesteremia    Hypertension    Hypothyroidism    Impacted ear wax    Joint pain    Obesity    Osteoarthritis    Other fatigue    Palpitations    Shortness of breath    Shortness of breath on exertion    Situational stress    Sleep apnea    no CPAP   Spasm    Thyroid disease    Tubular adenoma of colon    Vitamin D deficiency     Family History  Problem Relation Age of Onset   Heart disease Mother    Hyperlipidemia Mother    Heart disease Father    Hypertension Father    Sudden death Father    Sleep apnea Father    Obesity Father    Hypertension Brother    Heart disease Brother    Diabetes Brother    Breast cancer Neg Hx    Colon cancer Neg Hx    Esophageal cancer Neg Hx    Rectal cancer Neg Hx    Stomach cancer Neg Hx     Past Surgical History:  Procedure Laterality Date   ABDOMINAL HYSTERECTOMY  1986   APPENDECTOMY     CATARACT EXTRACTION W/ INTRAOCULAR LENS IMPLANT Bilateral 07/21/15, 09/22/15   COLONOSCOPY     TOE SURGERY     Unilateral Ooophorectomy     Social History   Occupational History    Employer: RETIRED  Tobacco Use   Smoking status: Never   Smokeless tobacco: Never  Vaping Use   Vaping Use: Never used  Substance and Sexual Activity   Alcohol use: Yes    Alcohol/week: 1.0 standard drink    Types: 1 Glasses of wine per week    Comment: occ   Drug use: Never   Sexual activity: Not Currently

## 2020-10-31 ENCOUNTER — Encounter: Payer: Self-pay | Admitting: Neurology

## 2020-10-31 ENCOUNTER — Other Ambulatory Visit: Payer: Self-pay

## 2020-10-31 ENCOUNTER — Ambulatory Visit (INDEPENDENT_AMBULATORY_CARE_PROVIDER_SITE_OTHER): Payer: MEDICARE | Admitting: Neurology

## 2020-10-31 DIAGNOSIS — G2581 Restless legs syndrome: Secondary | ICD-10-CM | POA: Diagnosis not present

## 2020-10-31 HISTORY — DX: Restless legs syndrome: G25.81

## 2020-10-31 MED ORDER — PRAMIPEXOLE DIHYDROCHLORIDE 0.125 MG PO TABS: 0.1250 mg | ORAL_TABLET | Freq: Every evening | ORAL | 3 refills | Status: DC | PRN

## 2020-10-31 NOTE — Patient Instructions (Signed)
Take Mirapex at night if needed for the restless legs.  Mirapex (pramipexole) may result in confusion or hallucinations, dizziness, drowsiness, or nausea. If any significant side effects are noted, please contact our office.

## 2020-10-31 NOTE — Progress Notes (Signed)
Reason for visit: Restless leg syndrome  Referring physician: Dr. Mady Gemma MCKENZIE Jacobs is a 80 y.o. female  History of present illness:  Toni Jacobs is a 80 year old right-handed white female with a history of restless leg syndrome that has been present for greater than a year.  The patient notes that primarily her left leg is involved but occasionally she may have symptoms on the right leg as well.  She will go to bed and note that the left leg starts becoming uncomfortable with a dull achy discomfort and then will start jumping.  The patient has to get up and move.  She may take pickle juice or mustard for this, but she does not describe any actual leg cramps per se.  She has issues with this about once a week on average.  At times, she may stay awake all night long because of the leg problem.  She reports no low back pain or pain down the legs.  She denies any numbness or tingling in the feet.  She denies issues controlling the bowels or the bladder.  The restless feeling does not affect her arms.  She comes to this office for further evaluation.  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   Chest pain syndrome    Diarrhea    Diverticulitis    Fatty liver    GERD (gastroesophageal reflux disease)    Hypercholesteremia    Hypertension    Hypothyroidism    Impacted ear wax    Joint pain    Obesity    Osteoarthritis    Other fatigue    Palpitations    Shortness of breath    Shortness of breath on exertion    Situational stress    Sleep apnea    no CPAP   Spasm    Thyroid disease    Tubular adenoma of colon    Vitamin D deficiency     Past Surgical History:  Procedure Laterality Date   ABDOMINAL HYSTERECTOMY  1986   APPENDECTOMY     CATARACT EXTRACTION W/ INTRAOCULAR LENS IMPLANT Bilateral 07/21/15, 09/22/15   COLONOSCOPY     TOE SURGERY     Unilateral Ooophorectomy       Family History  Problem Relation Age of Onset   Heart disease Mother    Hyperlipidemia Mother    Heart disease Father    Hypertension Father    Sudden death Father    Sleep apnea Father    Obesity Father    Hypertension Brother    Heart disease Brother    Diabetes Brother    Breast cancer Neg Hx    Colon cancer Neg Hx    Esophageal cancer Neg Hx    Rectal cancer Neg Hx    Stomach cancer Neg Hx     Social history:  reports that she has never smoked. She has never used smokeless tobacco. She reports current alcohol use of about 1.0 standard drink per week. She reports that she does not use drugs.  Medications:  Prior to Admission medications   Medication Sig Start Date End Date Taking? Authorizing Provider  amLODipine (NORVASC) 2.5 MG tablet Take 2.5 mg by mouth daily.   Yes [provider]  aspirin EC 81 MG tablet Take 1 tablet (81 mg total) by mouth daily. 01/09/19  Yes Belva Crome, MD  hydrochlorothiazide (HYDRODIURIL)  12.5 MG tablet  05/30/20  Yes [provider]  irbesartan (AVAPRO) 300 MG tablet Take 300 mg by mouth daily.   Yes [provider]  levothyroxine (SYNTHROID, LEVOTHROID) 137 MCG tablet Take 137 mcg by mouth daily before breakfast.  06/20/11  Yes [provider]  meloxicam (MOBIC) 15 MG tablet Take 1 tablet (15 mg total) by mouth daily as needed for pain. 10/19/20  Yes Mcarthur Rossetti, MD  Multiple Vitamins-Minerals (MULTIVITAMIN WITH MINERALS) tablet Take 1 tablet by mouth daily.   Yes [provider]  Vitamin D, Ergocalciferol, (DRISDOL) 1.25 MG (50000 UNIT) CAPS capsule Take 1 capsule (50,000 Units total) by mouth every 7 (seven) days. 08/25/20  Yes [name], PA-C  Wheat Dextrin (BENEFIBER) POWD 1-2 tablespoons each day for constipation 02/18/18  Yes Gatha Mayer, MD      Allergies  Allergen Reactions   Tribenzor [Olmesartan-Amlodipine-Hctz]     Heart Palpitations    ROS:  Out of a complete  14 system review of symptoms, the patient complains only of the following symptoms, and all other reviewed systems are negative.  Restless leg  Blood pressure (!) 149/89, pulse (!) 59, height 5' 3.5" (1.613 m), weight 172 lb 4 oz (78.1 kg).  Physical Exam  General: The patient is alert and cooperative at the time of the examination.  The patient is moderately obese.  Eyes: Pupils are equal, round, and reactive to light. Discs are flat bilaterally.  Neck: The neck is supple, no carotid bruits are noted.  Respiratory: The respiratory examination is clear.  Cardiovascular: The cardiovascular examination reveals a regular rate and rhythm, no obvious murmurs or rubs are noted.  Skin: Extremities are without significant edema.  Neurologic Exam  Mental status: The patient is alert and oriented x 3 at the time of the examination. The patient has apparent normal recent and remote memory, with an apparently normal attention span and concentration ability.  Cranial nerves: Facial symmetry is present. There is good sensation of the face to pinprick and soft touch bilaterally. The strength of the facial muscles and the muscles to head turning and shoulder shrug are normal bilaterally. Speech is well enunciated, no aphasia or dysarthria is noted. Extraocular movements are full. Visual fields are full. The tongue is midline, and the patient has symmetric elevation of the soft palate. No obvious hearing deficits are noted.  Motor: The motor testing reveals 5 over 5 strength of all 4 extremities. Good symmetric motor tone is noted throughout.  Sensory: Sensory testing is intact to pinprick, soft touch, vibration sensation, and position sense on all 4 extremities. No evidence of extinction is noted.  Coordination: Cerebellar testing reveals good finger-nose-finger and heel-to-shin bilaterally.  Gait and station: Gait is normal. Tandem gait is normal. Romberg is negative. No drift is seen.  Reflexes:  Deep tendon reflexes are symmetric and normal bilaterally. Toes are downgoing bilaterally.   Assessment/Plan:  1.  Restless leg syndrome  The patient is having episodes of restless leg syndrome occurring about once a week on average.  The patient will be given a prescription for low-dose Mirapex taking 0.125 mg if needed for the restless legs.  She may double the dose if needed.  She will follow-up here in 6 months.  In the future, she can be followed through Dr. Brett Fairy.  Jill Alexanders MD 10/31/2020 10:37 AM  [facility] [address] [place], [place] [address]  Phone [phone] Fax [phone]

## 2020-11-01 ENCOUNTER — Other Ambulatory Visit: Payer: Self-pay

## 2020-11-01 ENCOUNTER — Encounter (INDEPENDENT_AMBULATORY_CARE_PROVIDER_SITE_OTHER): Payer: Self-pay | Admitting: Bariatrics

## 2020-11-01 ENCOUNTER — Ambulatory Visit (INDEPENDENT_AMBULATORY_CARE_PROVIDER_SITE_OTHER): Payer: MEDICARE | Admitting: Bariatrics

## 2020-11-01 VITALS — BP 144/85 | HR 56 | Temp 97.7°F | Ht 63.0 in | Wt 169.0 lb

## 2020-11-01 DIAGNOSIS — I10 Essential (primary) hypertension: Secondary | ICD-10-CM

## 2020-11-01 DIAGNOSIS — Z6833 Body mass index (BMI) 33.0-33.9, adult: Secondary | ICD-10-CM

## 2020-11-01 DIAGNOSIS — E559 Vitamin D deficiency, unspecified: Secondary | ICD-10-CM

## 2020-11-01 DIAGNOSIS — E669 Obesity, unspecified: Secondary | ICD-10-CM

## 2020-11-01 MED ORDER — VITAMIN D (ERGOCALCIFEROL) 1.25 MG (50000 UNIT) PO CAPS: 50000.0000 [IU] | ORAL_CAPSULE | ORAL | 0 refills | Status: DC

## 2020-11-01 NOTE — Progress Notes (Signed)
Chief Complaint:   OBESITY Tanny is here to discuss her progress with her obesity treatment plan along with follow-up of her obesity related diagnoses. Silver is on the Category 1 Plan and states she is following her eating plan approximately 70% of the time. Stephanine states she is doing cardio for 15 minutes 2-3 times per week.  Today's visit was #: 7 Starting weight: 189 lbs Starting date: 05/31/2020 Today's weight: 169 lbs Today's date: 11/01/2020 Total lbs lost to date: 20 lbs Total lbs lost since last in-office visit: 3 lbs  Interim History: Aayliah is down another 3 lbs and doing well overall. She has been on vacation for 2 months but has still followed the principles.  Subjective:   1. Vitamin D deficiency Raejean is currently taking her medications as directed.   2. Primary hypertension Max's hypertension is reasonably well controlled.   Assessment/Plan:   1. Vitamin D deficiency Low Vitamin D level contributes to fatigue and are associated with obesity, breast, and colon cancer. We will refill prescription Vitamin D 50,000 IU every week for 1 month with no refills and Markala will follow-up for routine testing of Vitamin D, at least 2-3 times per year to avoid over-replacement.  - Vitamin D, Ergocalciferol, (DRISDOL) 1.25 MG (50000 UNIT) CAPS capsule; Take 1 capsule (50,000 Units total) by mouth every 7 (seven) days.  Dispense: 4 capsule; Refill: 0  2. Primary hypertension Yvonnda will continue medications. She is working on healthy weight loss and exercise to improve blood pressure control. We will watch for signs of hypotension as she continues her lifestyle modifications.   3. Obesity with current BMI 30 Nyaira is currently in the action stage of change. As such, her goal is to continue with weight loss efforts. She has agreed to the Category 1 Plan.   Dannita will continue meal planning. She will adhere closely to the plan (80-90%). She will decrease sweets and  carbohydrates.  Exercise goals:  Azareya will consider joining the gym.  Behavioral modification strategies: increasing lean protein intake, decreasing simple carbohydrates, increasing vegetables, increasing water intake, decreasing eating out, no skipping meals, meal planning and cooking strategies, keeping healthy foods in the home, and planning for success.  Daiona has agreed to follow-up with our clinic in 3-4 weeks with Dr. Leafy Ro or Mina Marble, NP. She was informed of the importance of frequent follow-up visits to maximize her success with intensive lifestyle modifications for her multiple health conditions.   Objective:   Blood pressure (!) 144/85, pulse (!) 56, temperature 97.7 F (36.5 C), height '5\' 3"'$  (1.6 m), weight 169 lb (76.7 kg), SpO2 96 %. Body mass index is 29.94 kg/m.  General: Cooperative, alert, well developed, in no acute distress. HEENT: Conjunctivae and lids unremarkable. Cardiovascular: Regular rhythm.  Lungs: Normal work of breathing. Neurologic: No focal deficits.   Lab Results  Component Value Date   CREATININE 0.80 05/31/2020   BUN 16 05/31/2020   NA 143 05/31/2020   K 4.4 05/31/2020   CL 103 05/31/2020   CO2 23 05/31/2020   Lab Results  Component Value Date   ALT 15 05/31/2020   AST 13 05/31/2020   ALKPHOS 116 05/31/2020   BILITOT 0.4 05/31/2020   Lab Results  Component Value Date   HGBA1C 5.5 05/31/2020   Lab Results  Component Value Date   INSULIN 18.4 05/31/2020   Lab Results  Component Value Date   TSH 3.710 05/31/2020   Lab Results  Component Value Date  CHOL 192 05/31/2020   HDL 46 05/31/2020   LDLCALC 112 (H) 05/31/2020   TRIG 195 (H) 05/31/2020   Lab Results  Component Value Date   VD25OH 29.6 (L) 05/31/2020   Lab Results  Component Value Date   WBC 5.3 05/31/2020   HGB 13.7 05/31/2020   HCT 42.6 05/31/2020   MCV 83 05/31/2020   PLT 208 05/31/2020   No results found for: IRON, TIBC, FERRITIN  Obesity  Behavioral Intervention:   Approximately 15 minutes were spent on the discussion below.  ASK: We discussed the diagnosis of obesity with Vaughan Basta today and September agreed to give [place] permission to discuss obesity behavioral modification therapy today.  ASSESS: Bijan has the diagnosis of obesity and her BMI today is 30.0. Cailan is in the action stage of change.   ADVISE: Analilia was educated on the multiple health risks of obesity as well as the benefit of weight loss to improve her health. She was advised of the need for long term treatment and the importance of lifestyle modifications to improve her current health and to decrease her risk of future health problems.  AGREE: Multiple dietary modification options and treatment options were discussed and Carling agreed to follow the recommendations documented in the above note.  ARRANGE: Lowella was educated on the importance of frequent visits to treat obesity as outlined per CMS and USPSTF guidelines and agreed to schedule her next follow up appointment today.  Attestation Statements:   Reviewed by clinician on day of visit: allergies, medications, problem list, medical history, surgical history, family history, social history, and previous encounter notes.   I, Lizbeth Bark, RMA, am acting as Location manager for [employer], DO.   I have reviewed the above documentation for accuracy and completeness, and I agree with the above. Jearld Lesch, DO

## 2020-11-03 ENCOUNTER — Encounter (INDEPENDENT_AMBULATORY_CARE_PROVIDER_SITE_OTHER): Payer: Self-pay | Admitting: Bariatrics

## 2020-11-22 ENCOUNTER — Encounter (INDEPENDENT_AMBULATORY_CARE_PROVIDER_SITE_OTHER): Payer: Self-pay | Admitting: Family Medicine

## 2020-11-22 ENCOUNTER — Ambulatory Visit (INDEPENDENT_AMBULATORY_CARE_PROVIDER_SITE_OTHER): Payer: MEDICARE | Admitting: Family Medicine

## 2020-11-22 ENCOUNTER — Other Ambulatory Visit: Payer: Self-pay

## 2020-11-22 VITALS — BP 132/80 | HR 63 | Temp 98.1°F | Ht 63.0 in | Wt 169.0 lb

## 2020-11-22 DIAGNOSIS — E559 Vitamin D deficiency, unspecified: Secondary | ICD-10-CM

## 2020-11-22 DIAGNOSIS — Z6833 Body mass index (BMI) 33.0-33.9, adult: Secondary | ICD-10-CM

## 2020-11-22 DIAGNOSIS — E669 Obesity, unspecified: Secondary | ICD-10-CM

## 2020-11-22 DIAGNOSIS — K5900 Constipation, unspecified: Secondary | ICD-10-CM

## 2020-11-22 MED ORDER — VITAMIN D (ERGOCALCIFEROL) 1.25 MG (50000 UNIT) PO CAPS: 50000.0000 [IU] | ORAL_CAPSULE | ORAL | 0 refills | Status: DC

## 2020-11-22 NOTE — Progress Notes (Signed)
Chief Complaint:   OBESITY Toni Jacobs is here to discuss her progress with her obesity treatment plan along with follow-up of her obesity related diagnoses. Toni Jacobs is on the Category 1 Plan and states she is following her eating plan approximately 80% of the time. Toni Jacobs states she is doing 0 minutes 0 times per week.  Today's visit was #: 8 Starting weight: 189 lbs Starting date: 05/31/2020 Today's weight: 169 lbs Today's date: 11/22/2020 Total lbs lost to date: 20 Total lbs lost since last in-office visit: 0  Interim History: Toni Jacobs has done well with weight loss, but she has been deviating from her plan more although she is trying to make healthier choices.  Subjective:   1. Vitamin D deficiency Toni Jacobs is on Vit D, and she denies signs of over-replacement.  2. Constipation, unspecified constipation type Toni Jacobs notes decreased BM frequency. She is taking Benefiber as needed.  Assessment/Plan:   1. Vitamin D deficiency Low Vitamin D level contributes to fatigue and are associated with obesity, breast, and colon cancer. We will refill prescription Vitamin D for 1 month. Toni Jacobs will follow-up for routine testing of Vitamin D, at least 2-3 times per year to avoid over-replacement.  - Vitamin D, Ergocalciferol, (DRISDOL) 1.25 MG (50000 UNIT) CAPS capsule; Take 1 capsule (50,000 Units total) by mouth every 7 (seven) days.  Dispense: 4 capsule; Refill: 0  2. Constipation, unspecified constipation type Toni Jacobs is to continue using Benefiber daily and increase her water intake, and will continue to follow. She was informed that a decrease in bowel movement frequency is normal while losing weight, but stools should not be hard or painful. Orders and follow up as documented in patient record.   3. Obesity with current BMI 30.0 Toni Jacobs is currently in the action stage of change. As such, her goal is to continue with weight loss efforts. She has agreed to the Category 1 Plan.   Exercise goals: Add  strengthening exercises, especially core strengthening.  Behavioral modification strategies: increasing water intake and meal planning and cooking strategies.  Toni Jacobs has agreed to follow-up with our clinic in 3 weeks. She was informed of the importance of frequent follow-up visits to maximize her success with intensive lifestyle modifications for her multiple health conditions.   Objective:   Blood pressure 132/80, pulse 63, temperature 98.1 F (36.7 C), height 5\' 3"  (1.6 m), weight 169 lb (76.7 kg), SpO2 96 %. Body mass index is 29.94 kg/m.  General: Cooperative, alert, well developed, in no acute distress. HEENT: Conjunctivae and lids unremarkable. Cardiovascular: Regular rhythm.  Lungs: Normal work of breathing. Neurologic: No focal deficits.   Lab Results  Component Value Date   CREATININE 0.80 05/31/2020   BUN 16 05/31/2020   NA 143 05/31/2020   K 4.4 05/31/2020   CL 103 05/31/2020   CO2 23 05/31/2020   Lab Results  Component Value Date   ALT 15 05/31/2020   AST 13 05/31/2020   ALKPHOS 116 05/31/2020   BILITOT 0.4 05/31/2020   Lab Results  Component Value Date   HGBA1C 5.5 05/31/2020   Lab Results  Component Value Date   INSULIN 18.4 05/31/2020   Lab Results  Component Value Date   TSH 3.710 05/31/2020   Lab Results  Component Value Date   CHOL 192 05/31/2020   HDL 46 05/31/2020   LDLCALC 112 (H) 05/31/2020   TRIG 195 (H) 05/31/2020   Lab Results  Component Value Date   VD25OH 29.6 (L) 05/31/2020  Lab Results  Component Value Date   WBC 5.3 05/31/2020   HGB 13.7 05/31/2020   HCT 42.6 05/31/2020   MCV 83 05/31/2020   PLT 208 05/31/2020   No results found for: IRON, TIBC, FERRITIN  Obesity Behavioral Intervention:   Approximately 15 minutes were spent on the discussion below.  ASK: We discussed the diagnosis of obesity with Toni Jacobs today and Toni Jacobs agreed to give [place] permission to discuss obesity behavioral modification therapy  today.  ASSESS: Toni Jacobs has the diagnosis of obesity and her BMI today is 30.0. Toni Jacobs is in the action stage of change.   ADVISE: Toni Jacobs was educated on the multiple health risks of obesity as well as the benefit of weight loss to improve her health. She was advised of the need for long term treatment and the importance of lifestyle modifications to improve her current health and to decrease her risk of future health problems.  AGREE: Multiple dietary modification options and treatment options were discussed and Toni Jacobs agreed to follow the recommendations documented in the above note.  ARRANGE: Toni Jacobs was educated on the importance of frequent visits to treat obesity as outlined per CMS and USPSTF guidelines and agreed to schedule her next follow up appointment today.  Attestation Statements:   Reviewed by clinician on day of visit: allergies, medications, problem list, medical history, surgical history, family history, social history, and previous encounter notes.   I, Trixie Dredge, am acting as transcriptionist for Dennard Nip, MD.  I have reviewed the above documentation for accuracy and completeness, and I agree with the above. -  Dennard Nip, MD

## 2020-12-06 DIAGNOSIS — E782 Mixed hyperlipidemia: Secondary | ICD-10-CM | POA: Diagnosis not present

## 2020-12-06 DIAGNOSIS — G4761 Periodic limb movement disorder: Secondary | ICD-10-CM | POA: Diagnosis not present

## 2020-12-06 DIAGNOSIS — Z23 Encounter for immunization: Secondary | ICD-10-CM | POA: Diagnosis not present

## 2020-12-06 DIAGNOSIS — Z0001 Encounter for general adult medical examination with abnormal findings: Secondary | ICD-10-CM | POA: Diagnosis not present

## 2020-12-06 DIAGNOSIS — Z0000 Encounter for general adult medical examination without abnormal findings: Secondary | ICD-10-CM | POA: Diagnosis not present

## 2020-12-06 DIAGNOSIS — D126 Benign neoplasm of colon, unspecified: Secondary | ICD-10-CM | POA: Diagnosis not present

## 2020-12-06 DIAGNOSIS — Z79899 Other long term (current) drug therapy: Secondary | ICD-10-CM | POA: Diagnosis not present

## 2020-12-06 DIAGNOSIS — E669 Obesity, unspecified: Secondary | ICD-10-CM | POA: Diagnosis not present

## 2020-12-06 DIAGNOSIS — E559 Vitamin D deficiency, unspecified: Secondary | ICD-10-CM | POA: Diagnosis not present

## 2020-12-06 DIAGNOSIS — N952 Postmenopausal atrophic vaginitis: Secondary | ICD-10-CM | POA: Diagnosis not present

## 2020-12-06 DIAGNOSIS — Z8249 Family history of ischemic heart disease and other diseases of the circulatory system: Secondary | ICD-10-CM | POA: Diagnosis not present

## 2020-12-06 DIAGNOSIS — E039 Hypothyroidism, unspecified: Secondary | ICD-10-CM | POA: Diagnosis not present

## 2020-12-06 DIAGNOSIS — Z1389 Encounter for screening for other disorder: Secondary | ICD-10-CM | POA: Diagnosis not present

## 2020-12-06 DIAGNOSIS — I10 Essential (primary) hypertension: Secondary | ICD-10-CM | POA: Diagnosis not present

## 2020-12-13 ENCOUNTER — Ambulatory Visit (INDEPENDENT_AMBULATORY_CARE_PROVIDER_SITE_OTHER): Payer: MEDICARE | Admitting: Family Medicine

## 2021-01-02 ENCOUNTER — Other Ambulatory Visit: Payer: Self-pay

## 2021-01-02 ENCOUNTER — Encounter (INDEPENDENT_AMBULATORY_CARE_PROVIDER_SITE_OTHER): Payer: Self-pay | Admitting: Family Medicine

## 2021-01-02 ENCOUNTER — Ambulatory Visit (INDEPENDENT_AMBULATORY_CARE_PROVIDER_SITE_OTHER): Payer: MEDICARE | Admitting: Family Medicine

## 2021-01-02 VITALS — BP 104/67 | HR 68 | Temp 98.0°F | Ht 63.0 in | Wt 170.0 lb

## 2021-01-02 DIAGNOSIS — E559 Vitamin D deficiency, unspecified: Secondary | ICD-10-CM | POA: Diagnosis not present

## 2021-01-02 DIAGNOSIS — E669 Obesity, unspecified: Secondary | ICD-10-CM | POA: Diagnosis not present

## 2021-01-02 DIAGNOSIS — Z6833 Body mass index (BMI) 33.0-33.9, adult: Secondary | ICD-10-CM | POA: Diagnosis not present

## 2021-01-02 NOTE — Progress Notes (Signed)
Chief Complaint:   OBESITY Yanelle is here to discuss her progress with her obesity treatment plan along with follow-up of her obesity related diagnoses. Shaleigh is on the Category 1 Plan and states she is following her eating plan approximately 60% of the time. Amberlyn states she is active while doing home activities.   Today's visit was #: 9 Starting weight: 189 lbs Starting date: 05/31/2020 Today's weight: 170 lbs Today's date: 01/02/2021 Total lbs lost to date: 19 Total lbs lost since last in-office visit: 0  Interim History: Dajanae is retaining some fluid today. She is doing well with diet and weight loss. Her hunger is mostly controlled. She has plans for Thanksgiving and she is planning on portion controlling and making smarter choices.  Subjective:   1. Vitamin D deficiency Elexius is on Vit D, but her level is not yet at goal. She has labs done at her primary care physician's office, but there are no results in [place].  Assessment/Plan:   1. Vitamin D deficiency Low Vitamin D level contributes to fatigue and are associated with obesity, breast, and colon cancer. We will refill prescription Vitamin D 50,000 IU every week #4 for 1 month. We will get her labs form her primary care physician Sadie Haber). Clotine will follow-up for routine testing of Vitamin D, at least 2-3 times per year to avoid over-replacement.  2. Obesity BMI today is 30 Jonai is currently in the action stage of change. As such, her goal is to continue with weight loss efforts. She has agreed to the Category 1 Plan.   Exercise goals: As is.  Behavioral modification strategies: increasing vegetables and holiday eating strategies .  Latrice has agreed to follow-up with our clinic in 3 to 4 weeks. She was informed of the importance of frequent follow-up visits to maximize her success with intensive lifestyle modifications for her multiple health conditions.   Objective:   Blood pressure 104/67, pulse 68, temperature 98  F (36.7 C), height 5\' 3"  (1.6 m), weight 170 lb (77.1 kg), SpO2 100 %. Body mass index is 30.11 kg/m.  General: Cooperative, alert, well developed, in no acute distress. HEENT: Conjunctivae and lids unremarkable. Cardiovascular: Regular rhythm.  Lungs: Normal work of breathing. Neurologic: No focal deficits.   Lab Results  Component Value Date   CREATININE 0.80 05/31/2020   BUN 16 05/31/2020   NA 143 05/31/2020   K 4.4 05/31/2020   CL 103 05/31/2020   CO2 23 05/31/2020   Lab Results  Component Value Date   ALT 15 05/31/2020   AST 13 05/31/2020   ALKPHOS 116 05/31/2020   BILITOT 0.4 05/31/2020   Lab Results  Component Value Date   HGBA1C 5.5 05/31/2020   Lab Results  Component Value Date   INSULIN 18.4 05/31/2020   Lab Results  Component Value Date   TSH 3.710 05/31/2020   Lab Results  Component Value Date   CHOL 192 05/31/2020   HDL 46 05/31/2020   LDLCALC 112 (H) 05/31/2020   TRIG 195 (H) 05/31/2020   Lab Results  Component Value Date   VD25OH 29.6 (L) 05/31/2020   Lab Results  Component Value Date   WBC 5.3 05/31/2020   HGB 13.7 05/31/2020   HCT 42.6 05/31/2020   MCV 83 05/31/2020   PLT 208 05/31/2020   No results found for: IRON, TIBC, FERRITIN  Obesity Behavioral Intervention:   Approximately 15 minutes were spent on the discussion below.  ASK: We discussed the diagnosis  of obesity with Vaughan Basta today and Melady agreed to give [place] permission to discuss obesity behavioral modification therapy today.  ASSESS: Denny has the diagnosis of obesity and her BMI today is 30.2. Leticia is in the action stage of change.   ADVISE: Malaina was educated on the multiple health risks of obesity as well as the benefit of weight loss to improve her health. She was advised of the need for long term treatment and the importance of lifestyle modifications to improve her current health and to decrease her risk of future health problems.  AGREE: Multiple dietary  modification options and treatment options were discussed and Georgiann agreed to follow the recommendations documented in the above note.  ARRANGE: Leala was educated on the importance of frequent visits to treat obesity as outlined per CMS and USPSTF guidelines and agreed to schedule her next follow up appointment today.  Attestation Statements:   Reviewed by clinician on day of visit: allergies, medications, problem list, medical history, surgical history, family history, social history, and previous encounter notes.   I, Trixie Dredge, am acting as transcriptionist for Dennard Nip, MD.  I have reviewed the above documentation for accuracy and completeness, and I agree with the above. -  Dennard Nip, MD

## 2021-01-03 DIAGNOSIS — H43813 Vitreous degeneration, bilateral: Secondary | ICD-10-CM | POA: Diagnosis not present

## 2021-01-03 DIAGNOSIS — H16223 Keratoconjunctivitis sicca, not specified as Sjogren's, bilateral: Secondary | ICD-10-CM | POA: Diagnosis not present

## 2021-01-03 DIAGNOSIS — H524 Presbyopia: Secondary | ICD-10-CM | POA: Diagnosis not present

## 2021-01-03 DIAGNOSIS — H02834 Dermatochalasis of left upper eyelid: Secondary | ICD-10-CM | POA: Diagnosis not present

## 2021-01-03 DIAGNOSIS — H5203 Hypermetropia, bilateral: Secondary | ICD-10-CM | POA: Diagnosis not present

## 2021-01-03 DIAGNOSIS — Z8669 Personal history of other diseases of the nervous system and sense organs: Secondary | ICD-10-CM | POA: Diagnosis not present

## 2021-01-03 DIAGNOSIS — H02831 Dermatochalasis of right upper eyelid: Secondary | ICD-10-CM | POA: Diagnosis not present

## 2021-01-03 DIAGNOSIS — H35362 Drusen (degenerative) of macula, left eye: Secondary | ICD-10-CM | POA: Diagnosis not present

## 2021-01-03 DIAGNOSIS — Z961 Presence of intraocular lens: Secondary | ICD-10-CM | POA: Diagnosis not present

## 2021-01-03 DIAGNOSIS — H52203 Unspecified astigmatism, bilateral: Secondary | ICD-10-CM | POA: Diagnosis not present

## 2021-01-04 ENCOUNTER — Other Ambulatory Visit: Payer: Self-pay | Admitting: Radiology

## 2021-01-04 DIAGNOSIS — N6021 Fibroadenosis of right breast: Secondary | ICD-10-CM | POA: Diagnosis not present

## 2021-01-04 DIAGNOSIS — R921 Mammographic calcification found on diagnostic imaging of breast: Secondary | ICD-10-CM | POA: Diagnosis not present

## 2021-01-04 MED ORDER — VITAMIN D (ERGOCALCIFEROL) 1.25 MG (50000 UNIT) PO CAPS: 50000.0000 [IU] | ORAL_CAPSULE | ORAL | 0 refills | Status: DC

## 2021-01-25 ENCOUNTER — Ambulatory Visit (INDEPENDENT_AMBULATORY_CARE_PROVIDER_SITE_OTHER): Payer: MEDICARE | Admitting: Family Medicine

## 2021-03-07 DIAGNOSIS — E669 Obesity, unspecified: Secondary | ICD-10-CM | POA: Diagnosis not present

## 2021-03-07 DIAGNOSIS — E039 Hypothyroidism, unspecified: Secondary | ICD-10-CM | POA: Diagnosis not present

## 2021-03-07 DIAGNOSIS — R946 Abnormal results of thyroid function studies: Secondary | ICD-10-CM | POA: Diagnosis not present

## 2021-03-07 DIAGNOSIS — R350 Frequency of micturition: Secondary | ICD-10-CM | POA: Diagnosis not present

## 2021-03-07 DIAGNOSIS — R7989 Other specified abnormal findings of blood chemistry: Secondary | ICD-10-CM | POA: Diagnosis not present

## 2021-03-21 DIAGNOSIS — E039 Hypothyroidism, unspecified: Secondary | ICD-10-CM | POA: Diagnosis not present

## 2021-04-25 ENCOUNTER — Ambulatory Visit (INDEPENDENT_AMBULATORY_CARE_PROVIDER_SITE_OTHER): Payer: MEDICARE | Admitting: Family Medicine

## 2021-04-25 ENCOUNTER — Encounter (INDEPENDENT_AMBULATORY_CARE_PROVIDER_SITE_OTHER): Payer: Self-pay

## 2021-04-26 ENCOUNTER — Encounter (INDEPENDENT_AMBULATORY_CARE_PROVIDER_SITE_OTHER): Payer: Self-pay | Admitting: Adult Health

## 2021-04-26 ENCOUNTER — Other Ambulatory Visit: Payer: Self-pay

## 2021-04-26 ENCOUNTER — Ambulatory Visit (INDEPENDENT_AMBULATORY_CARE_PROVIDER_SITE_OTHER): Payer: MEDICARE | Admitting: Adult Health

## 2021-04-26 VITALS — BP 130/80 | HR 72 | Temp 97.7°F | Ht 63.0 in | Wt 173.0 lb

## 2021-04-26 DIAGNOSIS — E8881 Metabolic syndrome: Secondary | ICD-10-CM

## 2021-04-26 DIAGNOSIS — Z6833 Body mass index (BMI) 33.0-33.9, adult: Secondary | ICD-10-CM

## 2021-04-26 DIAGNOSIS — E559 Vitamin D deficiency, unspecified: Secondary | ICD-10-CM

## 2021-04-26 DIAGNOSIS — G2581 Restless legs syndrome: Secondary | ICD-10-CM | POA: Diagnosis not present

## 2021-04-26 DIAGNOSIS — Z6830 Body mass index (BMI) 30.0-30.9, adult: Secondary | ICD-10-CM

## 2021-04-26 DIAGNOSIS — E669 Obesity, unspecified: Secondary | ICD-10-CM

## 2021-04-27 NOTE — Progress Notes (Signed)
? ? ? ?Chief Complaint:  ? ?OBESITY ?Toni Jacobs is here to discuss Toni Jacobs's progress with Toni Jacobs's obesity treatment plan along with follow-up of Toni Jacobs's obesity related diagnoses. Toni Jacobs is on the Category 1 Plan and states Toni Jacobs is following Toni Jacobs's eating plan approximately 50-60% of the time. Toni Jacobs states Toni Jacobs is doing exercise videos for 30 minutes 2 times per week. ? ?Today's visit was #: 10 ?Starting weight: 189 lbs ?Starting date: 05/31/2020 ?Today's weight: 173 lbs ?Today's date: 04/26/2021 ?Total lbs lost to date: 16 lbs ?Total lbs lost since last in-office visit: 0 ? ?Interim History: ?This is Toni Jacobs's first office visit since 01/02/2021 - last with Dr. Leafy Ro. ?She endorses the following barriers for her to attend OVs at [facility]- birthday party and a cruise. ?Current weight 173 lbs, her reported goal weight is 150 lbs. ?The last time she was at 150 lbs, was approximately 40 years ago. ? ?Subjective:  ? ?1. Vitamin D deficiency ?On 05/31/2020, vitamin D level - 29.6 - well below goal of 50-70. ?She has been off ergocalciferol since November 2022 due to lack of follow-up. ? ?2. Insulin resistance ?On 05/31/2020, BG 94, A1c 5.5, insulin level 18.4 - elevated. ? ?3. RLS (restless legs syndrome) ?Referred to Neurology, Toni Jacobs in June 2022. ?She was seen by Neurology/Dr. Jannifer Jacobs - 10/31/2020. ?Provided with Mirapex 0.125 mg QHS - last used December 2022. ? ?Assessment/Plan:  ? ?1. Vitamin D deficiency ?Check vitamin D level at next office visit/bring labs to next office visit. ? ?2. Insulin resistance ?Bring labs that were completed with PCP to next office visit. ? ?3. RLS (restless legs syndrome) ?Follow-up with Neurology this month. ? ?4. Obesity BMI today is 30.7 ? ?Toni Jacobs is currently in the action stage of change. As such, Toni Jacobs's goal is to continue with weight loss efforts. Toni Jacobs has agreed to the Category 1 Plan.   ? ?Check IC at next office visit, arrive 30 minutes early. ? ?Bring recent labs from [facility]/PCP. ? ?Exercise goals:  As is. ? ?Behavioral modification strategies: increasing lean protein intake, decreasing simple carbohydrates, meal planning and cooking strategies, keeping healthy foods in the home, and planning for success. ? ?Toni Jacobs has agreed to follow-up with our clinic in 3 weeks, IC. Toni Jacobs was informed of the importance of frequent follow-up visits to maximize Toni Frederick Triska's success with intensive lifestyle modifications for Toni Frederick Krus's multiple health conditions.  ? ?Objective:  ? ?Blood pressure 130/80, pulse 72, temperature 97.7 ?F (36.5 ?C), height '5\' 3"'$  (1.6 m), weight 173 lb (78.5 kg), SpO2 99 %. ?Body mass index is 30.65 kg/m?. ? ?General: Cooperative, alert, well developed, in no acute distress. ?HEENT: Conjunctivae and lids unremarkable. ?Cardiovascular: Regular rhythm.  ?Lungs: Normal work of breathing. ?Neurologic: No focal deficits.  ? ?Lab Results  ?Component Value Date  ? CREATININE 0.80 05/31/2020  ? BUN 16 05/31/2020  ? NA 143 05/31/2020  ? K 4.4 05/31/2020  ? CL 103 05/31/2020  ? CO2 23 05/31/2020  ? ?Lab Results  ?Component Value Date  ? ALT 15 05/31/2020  ? AST 13 05/31/2020  ? ALKPHOS 116 05/31/2020  ? BILITOT 0.4 05/31/2020  ? ?Lab Results  ?Component Value Date  ? HGBA1C 5.5 05/31/2020  ? ?Lab Results  ?Component Value Date  ? INSULIN 18.4 05/31/2020  ? ?Lab Results  ?Component Value Date  ? TSH 3.710 05/31/2020  ? ?Lab Results  ?  Component Value Date  ? CHOL 192 05/31/2020  ? HDL 46 05/31/2020  ? LDLCALC 112 (H) 05/31/2020  ? TRIG 195 (H) 05/31/2020  ? ?Lab Results  ?Component Value Date  ? VD25OH 29.6 (L) 05/31/2020  ? ?Lab Results  ?Component Value Date  ? WBC 5.3 05/31/2020  ? HGB 13.7 05/31/2020  ? HCT 42.6 05/31/2020  ? MCV 83 05/31/2020  ? PLT 208 05/31/2020  ? ?Attestation Statements:  ? ?Reviewed by clinician on day of visit: allergies, medications,  problem list, medical history, surgical history, family history, social history, and previous encounter notes. ? ?Time spent on visit including pre-visit chart review and post-visit care and charting was 28 minutes.  ? ?I, Water quality scientist, CMA, am acting as Location manager for Mina Marble, NP. ? ?I have reviewed the above documentation for accuracy and completeness, and I agree with the above. -  Maira Christon d. Racer Quam, NP-C ?

## 2021-05-25 ENCOUNTER — Ambulatory Visit (INDEPENDENT_AMBULATORY_CARE_PROVIDER_SITE_OTHER): Payer: MEDICARE | Admitting: Adult Health

## 2021-09-27 ENCOUNTER — Encounter (INDEPENDENT_AMBULATORY_CARE_PROVIDER_SITE_OTHER): Payer: Self-pay

## 2021-09-27 DIAGNOSIS — N390 Urinary tract infection, site not specified: Secondary | ICD-10-CM | POA: Diagnosis not present

## 2021-10-06 DIAGNOSIS — R319 Hematuria, unspecified: Secondary | ICD-10-CM | POA: Diagnosis not present

## 2021-10-06 DIAGNOSIS — N1330 Unspecified hydronephrosis: Secondary | ICD-10-CM | POA: Diagnosis not present

## 2021-10-11 DIAGNOSIS — N1330 Unspecified hydronephrosis: Secondary | ICD-10-CM | POA: Diagnosis not present

## 2021-10-11 DIAGNOSIS — R3121 Asymptomatic microscopic hematuria: Secondary | ICD-10-CM | POA: Diagnosis not present

## 2021-10-11 DIAGNOSIS — R350 Frequency of micturition: Secondary | ICD-10-CM | POA: Diagnosis not present

## 2021-10-11 DIAGNOSIS — I700 Atherosclerosis of aorta: Secondary | ICD-10-CM | POA: Diagnosis not present

## 2021-10-13 DIAGNOSIS — R194 Change in bowel habit: Secondary | ICD-10-CM | POA: Diagnosis not present

## 2021-10-13 DIAGNOSIS — R935 Abnormal findings on diagnostic imaging of other abdominal regions, including retroperitoneum: Secondary | ICD-10-CM | POA: Diagnosis not present

## 2021-10-27 DIAGNOSIS — Z1211 Encounter for screening for malignant neoplasm of colon: Secondary | ICD-10-CM | POA: Diagnosis not present

## 2021-10-30 ENCOUNTER — Telehealth: Payer: Self-pay | Admitting: Internal Medicine

## 2021-10-30 NOTE — Telephone Encounter (Signed)
Okay to establish.

## 2021-10-30 NOTE — Telephone Encounter (Signed)
Pt called stating that her daughter, Ellary Casamento, had a visit with Dr. Lorelei Pont and she told her that she would be willing to take [facility] on as a pt. Pt was advised that a note would have to be sent back to get that approval in writing. Pt acknowledged understanding.  Is NP approval ok for this pt?

## 2021-11-01 DIAGNOSIS — R195 Other fecal abnormalities: Secondary | ICD-10-CM | POA: Diagnosis not present

## 2021-11-01 DIAGNOSIS — R194 Change in bowel habit: Secondary | ICD-10-CM | POA: Diagnosis not present

## 2021-11-01 LAB — CBC AND DIFFERENTIAL
HCT: 35 — AB (ref 39–52)
Hemoglobin: 11 — AB (ref 13.0–17.0)
Platelets: 227 10*3/uL (ref 150–400)
WBC: 5.8

## 2021-11-12 NOTE — Progress Notes (Unsigned)
[place] at [facility] [address] [place], [place] [address] [phone]  Date:  11/15/2021   Name:  CELISSE CIULLA   DOB:  06-25-1940   MRN:  [id number]  PCP:  Josetta Huddle, MD    Chief Complaint: No chief complaint on file.   History of Present Illness:  TORUNN CHANCELLOR is a 81 y.o. 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   Lax, ligament 08/28/2011   VFD (visual field defect) 08/28/2011   HYPOTHYROIDISM 04/01/2009   Primary hypertension 04/01/2009   COUGH 04/01/2009   DIVERTICULITIS, HX OF 04/01/2009    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   Chest pain syndrome    Diarrhea    Diverticulitis    Fatty liver    GERD (gastroesophageal reflux disease)    Hypercholesteremia    Hypertension    Hypothyroidism    Impacted ear wax    Joint pain    Obesity    Osteoarthritis    Other fatigue    Palpitations    RLS (restless legs syndrome) 10/31/2020   Shortness of breath    Shortness of breath on exertion    Situational stress    Sleep apnea    no CPAP   Spasm    Thyroid disease    Tubular adenoma of colon    Vitamin D deficiency     Past Surgical History:  Procedure Laterality Date   ABDOMINAL HYSTERECTOMY  1986   APPENDECTOMY     CATARACT EXTRACTION W/ INTRAOCULAR LENS IMPLANT Bilateral 07/21/15, 09/22/15   COLONOSCOPY     TOE SURGERY     Unilateral Ooophorectomy      Social History   Tobacco Use   Smoking  status: Never   Smokeless tobacco: Never  Vaping Use   Vaping Use: Never used  Substance Use Topics   Alcohol use: Yes    Alcohol/week: 1.0 standard drink of alcohol    Types: 1 Glasses of wine per week    Comment: occ   Drug use: Never    Family History  Problem Relation Age of Onset   Heart disease Mother    Hyperlipidemia Mother    Heart disease Father    Hypertension Father    Sudden death Father    Sleep apnea Father    Obesity Father    Hypertension Brother    Heart disease Brother    Diabetes Brother    Breast cancer Neg Hx    Colon cancer Neg Hx    Esophageal cancer Neg Hx    Rectal cancer Neg Hx    Stomach cancer Neg Hx     Allergies  Allergen Reactions   Tribenzor [Olmesartan-Amlodipine-Hctz]     Heart Palpitations    Medication list has been reviewed and updated.  Current Outpatient Medications on File Prior to Visit  Medication Sig Dispense Refill   amLODipine (NORVASC) 2.5 MG tablet Take 2.5 mg by mouth daily.     aspirin  EC 81 MG tablet Take 1 tablet (81 mg total) by mouth daily. 90 tablet 3   hydrochlorothiazide (HYDRODIURIL) 12.5 MG tablet      irbesartan (AVAPRO) 300 MG tablet Take 300 mg by mouth daily.     levothyroxine (SYNTHROID, LEVOTHROID) 137 MCG tablet Take 137 mcg by mouth daily before breakfast.      Multiple Vitamins-Minerals (MULTIVITAMIN WITH MINERALS) tablet Take 1 tablet by mouth daily.     pramipexole (MIRAPEX) 0.125 MG tablet Take 1 tablet (0.125 mg total) by mouth at bedtime as needed. 30 tablet 3   Vitamin D, Ergocalciferol, (DRISDOL) 1.25 MG (50000 UNIT) CAPS capsule Take 1 capsule (50,000 Units total) by mouth every 7 (seven) days. 4 capsule 0   Wheat Dextrin (BENEFIBER) POWD 1-2 tablespoons each day for constipation  0   No current facility-administered medications on file prior to visit.    Review of Systems:  As per HPI- otherwise negative.   Physical Examination: There were no vitals filed for this visit. There  were no vitals filed for this visit. There is no height or weight on file to calculate BMI. Ideal Body Weight:    GEN: no acute distress. HEENT: Atraumatic, Normocephalic.  Ears and Nose: No external deformity. CV: RRR, No M/G/R. No JVD. No thrill. No extra heart sounds. PULM: CTA B, no wheezes, crackles, rhonchi. No retractions. No resp. distress. No accessory muscle use. ABD: S, NT, ND, +BS. No rebound. No HSM. EXTR: No c/c/e PSYCH: Normally interactive. Conversant.    Assessment and Plan: ***  Signed Lamar Blinks, MD

## 2021-11-15 ENCOUNTER — Ambulatory Visit (INDEPENDENT_AMBULATORY_CARE_PROVIDER_SITE_OTHER): Payer: MEDICARE | Admitting: Family Medicine

## 2021-11-15 ENCOUNTER — Other Ambulatory Visit (HOSPITAL_BASED_OUTPATIENT_CLINIC_OR_DEPARTMENT_OTHER): Payer: Self-pay

## 2021-11-15 ENCOUNTER — Encounter: Payer: Self-pay | Admitting: Family Medicine

## 2021-11-15 VITALS — BP 128/80 | HR 97 | Temp 97.9°F | Resp 18 | Ht 63.5 in | Wt 185.0 lb

## 2021-11-15 DIAGNOSIS — G2581 Restless legs syndrome: Secondary | ICD-10-CM | POA: Diagnosis not present

## 2021-11-15 DIAGNOSIS — Z7689 Persons encountering health services in other specified circumstances: Secondary | ICD-10-CM | POA: Diagnosis not present

## 2021-11-15 DIAGNOSIS — Z23 Encounter for immunization: Secondary | ICD-10-CM

## 2021-11-15 MED ORDER — GABAPENTIN 100 MG PO CAPS
ORAL_CAPSULE | ORAL | 3 refills | Status: DC
  Filled 2021-11-15: qty 90, 30d supply, fill #0

## 2021-11-15 NOTE — Patient Instructions (Signed)
It was very nice to meet you today!  Flu shot given I will request records from Dr Inda Merlin For restless legs- try taking 100 mg of gabapentin about 30 minutes prior to bedtime.  Can increase to 2 and then 3 pills if needed over a couple of weeks Let me know how this works for you!   Recommend covid shot this fall and RSV vaccine at your pharmacy   Assuming all is well please see me in about 6 months

## 2021-11-22 ENCOUNTER — Other Ambulatory Visit: Payer: Self-pay

## 2021-11-29 NOTE — Progress Notes (Addendum)
11/30/2021 YU CRAGUN [id number] 1940/03/20  Referring provider: Josetta Huddle, MD Primary GI doctor: Dr. Carlean Purl  ASSESSMENT AND PLAN:   Anemia with positive fecal occult blood, abnormal CT with narrowing at sigmoid colon. Addendum, received fax of CT from urology which showed moderate size hiatal hernia, normal stomach duodenum, small bowel normal, cecum normal ascending colon normal scattered diverticula transverse colon scar scattered diverticula descending colon proximal sigmoid colon region of luminal nondistention over 5.8 cm segment there is stool proximal to this luminal collapse findings also appreciated on coronal image for potential mucosal shouldering along the proximal margin of the luminal collapse the distal sigmoid colon and rectum are devoid of stool due to this abrupt transition from stool filling descending colon all findings are nonspecific there are some concern for sigmoid colorectal neoplasm.  We will plan for repeat colonoscopy last 01/2018 Patient does have history of extensive diverticulosis, possible stricture versus scad. We will repeat CBC, iron ferritin, celiac panel. If she has iron deficiency anemia may benefit from IV iron as she states pills cause constipation, is very symptomatic with restless legs If she also has a true iron deficiency anemia may want to consider adding EGD to colonoscopy but patient has no symptoms Will talk further with Dr. Carlean Purl We have discussed the risks of bleeding, infection, perforation, medication reactions, and remote risk of death associated with colonoscopy. All questions were answered and the patient acknowledges these risk and wishes to proceed.  Abnormal stools Patient describes small-volume loose to formed incomplete bowel movements. Likely this is from tortuous colon with decreased sphincter tone however with possible narrowing at sigmoid colon could also be potentially partial obstructive symptoms Add on  MiraLAX and fiber Proceed with colonoscopy as planned If unremarkable would suggest potentially pelvic floor physical therapy in the future door anal manometry  History of adenomatous polyp of colon 02/18/2018 colonoscopy Dr. Carlean Purl for surveillance, personal history of adenomatous polyps, adequate bowel prep, tortuous colon.  skin tags, hemorrhoids, decreased sphincter tone, 1 sessile serrated diminutive polyp sigmoid colon and transverse, severe diverticulosis sigmoid descending and transverse, external and internal hemorrhoids. No recall due to age.  History of Present Illness:  81 y.o. adult  with a past medical history of hypertension, hypothyroidism, vitamin D deficiency, history of diverticulosis and others listed below, returns to clinic today for evaluation of rectal bleeding.  02/18/2018 colonoscopy Dr. Carlean Purl for surveillance, personal history of adenomatous polyps, adequate bowel prep, tortuous colon.  skin tags, hemorrhoids, decreased sphincter tone, 1 sessile serrated diminutive polyp sigmoid colon and transverse, severe diverticulosis sigmoid descending and transverse, external and internal hemorrhoids. No recall due to age.  She has 3 small volume BM's a day, soft stools but formed.  States she has a hard time cleaning her stools, she has rare fecal incontinence.  She has increased urinary frequency, has seen [facility] and had CT scan that showed narrowing in the sigmoid colon suggested colonoscopy to exclude neoplasm. She has not seen any blood in the stool but had anemia with HGB 11 10/2021 and positive FOBT with Dr. Inda Merlin, he has retired now following [employer]..  She has AB pain with BM.  Denies nausea, vomiting, GERD.  Denies weight loss.  She has restless legs badly at night, gapapentin not helping. She has been on iron pills for 2-3 days.    Lorin Picket  reports that [employer]. [place] has never smoked. [employer] has never used smokeless  tobacco. Lorri Frederick. Beske reports  current alcohol use of about 1.0 standard drink of alcohol per week. Meganne P. Madewell reports that [employer]. [place] does not use drugs. Rhoda Waldvogel Roarty's family history includes Diabetes in [place]. Nesheim's brother; Heart disease in [place]. Buckles's brother, father, and mother; Hyperlipidemia in [place]. Len's mother; Hypertension in [place]. Huckins's brother and father; Obesity in [place]. Degroote's father; Sleep apnea in [place]. Belfield's father; Sudden death in [place]. Kizer's father.   Current Medications:   Current Outpatient Medications (Endocrine & Metabolic):    levothyroxine (SYNTHROID) 150 MCG tablet, Take 150 mcg by mouth daily before breakfast.  Current Outpatient Medications (Cardiovascular):    amLODipine (NORVASC) 2.5 MG tablet, Take 2.5 mg by mouth daily.   irbesartan (AVAPRO) 300 MG tablet, Take 300 mg by mouth daily.  Current Outpatient Medications (Respiratory):    promethazine (PHENERGAN) 25 MG tablet, Take 25 mg by mouth every 6 (six) hours as needed for nausea or vomiting. (Patient not taking: Reported on 11/30/2021)  Current Outpatient Medications (Analgesics):    meloxicam (MOBIC) 15 MG tablet, Take 15 mg by mouth daily. (Patient not taking: Reported on 11/30/2021)   oxyCODONE-acetaminophen (PERCOCET) 10-325 MG tablet, Take 1 tablet by mouth every 4 (four) hours as needed for pain. (Patient not taking: Reported on 11/30/2021)   Current Outpatient Medications (Other):    ALPRAZolam (XANAX) 0.25 MG tablet, Take 0.25 mg by mouth daily as needed.   ciprofloxacin (CIPRO) 500 MG tablet, Take 500 mg by mouth 2 (two) times daily.   gabapentin (NEURONTIN) 100 MG capsule, Start with 100 mg at bedtime, can increase to 300 mg over 1-2 weeks if needed   Multiple Vitamins-Minerals (MULTIVITAMIN WITH MINERALS) tablet, Take 1 tablet by mouth daily.   Vitamin D, Ergocalciferol, (DRISDOL) 1.25 MG (50000  UNIT) CAPS capsule, Take 1 capsule (50,000 Units total) by mouth every 7 (seven) days.  Surgical History:  RAYEN PALEN  has a past surgical history that includes Abdominal hysterectomy (1986); Unilateral Ooophorectomy; Appendectomy; Toe Surgery; Cataract extraction w/ intraocular lens implant (Bilateral, 07/21/15, 09/22/15); and Colonoscopy.  Current Medications, Allergies, Past Medical History, Past Surgical History, Family History and Social History were reviewed in [employer] record.  Physical Exam: BP 130/80 (BP Location: Left Arm, Patient Position: Sitting, Cuff Size: Normal)   Pulse 78   Ht '5\' 3"'$  (1.6 m)   Wt 189 lb 2 oz (85.8 kg)   SpO2 94%   BMI 33.50 kg/m  General:   Pleasant, well developed adult in no acute distress Heart : Regular rate and rhythm; no murmurs Pulm: Clear anteriorly; no wheezing Abdomen:  Soft, Obese AB, Active bowel sounds. mild tenderness in the lower abdomen. Without guarding and Without rebound, No organomegaly appreciated. Rectal: skin tags, decreased rectal tone, appreciated internal hemorrhoids, non-tender, no masses, ,scant brown stool, hemoccult Positive Extremities:  with  edema. Neurologic:  Alert and  oriented x4;  No focal deficits.  Psych:  Cooperative. Normal mood and affect.   Vladimir Crofts, PA-C 11/30/21

## 2021-11-30 ENCOUNTER — Encounter: Payer: Self-pay | Admitting: Physician Assistant

## 2021-11-30 ENCOUNTER — Other Ambulatory Visit: Payer: Self-pay

## 2021-11-30 ENCOUNTER — Other Ambulatory Visit (INDEPENDENT_AMBULATORY_CARE_PROVIDER_SITE_OTHER): Payer: MEDICARE

## 2021-11-30 ENCOUNTER — Ambulatory Visit (INDEPENDENT_AMBULATORY_CARE_PROVIDER_SITE_OTHER): Payer: MEDICARE | Admitting: Physician Assistant

## 2021-11-30 VITALS — BP 130/80 | HR 78 | Ht 63.0 in | Wt 189.1 lb

## 2021-11-30 DIAGNOSIS — D509 Iron deficiency anemia, unspecified: Secondary | ICD-10-CM

## 2021-11-30 DIAGNOSIS — Z86010 Personal history of colonic polyps: Secondary | ICD-10-CM

## 2021-11-30 DIAGNOSIS — R195 Other fecal abnormalities: Secondary | ICD-10-CM

## 2021-11-30 DIAGNOSIS — K625 Hemorrhage of anus and rectum: Secondary | ICD-10-CM

## 2021-11-30 DIAGNOSIS — D649 Anemia, unspecified: Secondary | ICD-10-CM | POA: Diagnosis not present

## 2021-11-30 DIAGNOSIS — R790 Abnormal level of blood mineral: Secondary | ICD-10-CM

## 2021-11-30 LAB — CBC WITH DIFFERENTIAL/PLATELET
Basophils Absolute: 0 10*3/uL (ref 0.0–0.1)
Basophils Relative: 0.8 % (ref 0.0–3.0)
Eosinophils Absolute: 0.1 10*3/uL (ref 0.0–0.7)
Eosinophils Relative: 2.9 % (ref 0.0–5.0)
HCT: 35.1 % — ABNORMAL LOW (ref 36.0–46.0)
Hemoglobin: 11.2 g/dL — ABNORMAL LOW (ref 12.0–15.0)
Lymphocytes Relative: 24.1 % (ref 12.0–46.0)
Lymphs Abs: 1 10*3/uL (ref 0.7–4.0)
MCHC: 31.9 g/dL (ref 30.0–36.0)
MCV: 73 fl — ABNORMAL LOW (ref 78.0–100.0)
Monocytes Absolute: 0.4 10*3/uL (ref 0.1–1.0)
Monocytes Relative: 8.8 % (ref 3.0–12.0)
Neutro Abs: 2.5 10*3/uL (ref 1.4–7.7)
Neutrophils Relative %: 63.4 % (ref 43.0–77.0)
Platelets: 215 10*3/uL (ref 150.0–400.0)
RBC: 4.81 Mil/uL (ref 3.87–5.11)
RDW: 17.1 % — ABNORMAL HIGH (ref 11.5–15.5)
WBC: 4 10*3/uL (ref 4.0–10.5)

## 2021-11-30 LAB — COMPREHENSIVE METABOLIC PANEL
ALT: 11 U/L (ref 0–35)
AST: 14 U/L (ref 0–37)
Albumin: 4.2 g/dL (ref 3.5–5.2)
Alkaline Phosphatase: 77 U/L (ref 39–117)
BUN: 25 mg/dL — ABNORMAL HIGH (ref 6–23)
CO2: 30 mEq/L (ref 19–32)
Calcium: 9.7 mg/dL (ref 8.4–10.5)
Chloride: 105 mEq/L (ref 96–112)
Creatinine, Ser: 0.87 mg/dL (ref 0.40–1.20)
GFR: 63.26 mL/min (ref >60.00)
Glucose, Bld: 96 mg/dL (ref 70–99)
Potassium: 3.7 mEq/L (ref 3.5–5.1)
Sodium: 142 mEq/L (ref 135–145)
Total Bilirubin: 0.4 mg/dL (ref 0.2–1.2)
Total Protein: 6.9 g/dL (ref 6.0–8.3)

## 2021-11-30 LAB — IBC + FERRITIN
Ferritin: 7.8 ng/mL — ABNORMAL LOW (ref 10.0–291.0)
Iron: 50 ug/dL (ref 42–145)
Saturation Ratios: 13.4 % — ABNORMAL LOW (ref 20.0–50.0)
TIBC: 373.8 ug/dL (ref 250.0–450.0)
Transferrin: 267 mg/dL (ref 212.0–360.0)

## 2021-11-30 NOTE — Patient Instructions (Addendum)
Your provider has requested that you go to the basement level for lab work before leaving today. Press "B" on the elevator. The lab is located at the first door on the left as you exit the elevator.  You have been scheduled for a colonoscopy. Please follow written instructions given to you at your visit today.  Please pick up your prep supplies at the pharmacy within the next 1-3 days. If you use inhalers (even only as needed), please bring them with you on the day of your procedure.   FIBER SUPPLEMENT You can do metamucil or fibercon once or twice a day but if this causes gas/bloating please switch to Benefiber or Citracel.  Fiber is good for constipation/diarrhea/irritable bowel syndrome.  It can also help with weight loss and can help lower your bad cholesterol (LDL).  Please do 1 TBSP in the morning in water, coffee, or tea.  It can take up to a month before you can see a difference with your bowel movements.  It is cheapest from [employer], [employer], [employer].   Recommend starting on a fiber supplement, can try metamucil first but if this causes gas/bloating switch to benefiber or citracel, these do not cause gas.  Take with fiber with with a full 8 oz glass of water once a day. This can take 1 month to start helping, so try for at least one month.  Recommend increasing water and physical activity.   - Drink at least 64-80 ounces of water/liquid per day. - Establish a time to try to move your bowels every day.  For many people, this is after a cup of coffee or after a meal such as breakfast. - Sit all of the way back on the toilet keeping your back fairly straight and while sitting up, try to rest the tops of your forearms on your upper thighs.   - Raising your feet with a step stool/squatty potty can be helpful to improve the angle that allows your stool to pass through the rectum. - Relax the rectum feeling it bulge toward the toilet water.  If you feel your rectum raising toward your body, you  are contracting rather than relaxing. - Breathe in and slowly exhale. "Belly breath" by expanding your belly towards your belly button. Keep belly expanded as you gently direct pressure down and back to the anus.  A low pitched GRRR sound can assist with increasing intra-abdominal pressure.  - Repeat 3-4 times. If unsuccessful, contract the pelvic floor to restore normal tone and get off the toilet.  Avoid excessive straining. - To reduce excessive wiping by teaching your anus to normally contract, place hands on outer aspect of knees and resist knee movement outward.  Hold 5-10 second then place hands just inside of knees and resist inward movement of knees.  Hold 5 seconds.  Repeat a few times each way.  Go to the ER if unable to pass gas, severe AB pain, unable to hold down food, any shortness of breath of chest pain.

## 2021-12-01 LAB — IGA: Immunoglobulin A: 224 mg/dL (ref 70–320)

## 2021-12-01 LAB — TISSUE TRANSGLUTAMINASE, IGA: (tTG) Ab, IgA: <1 U/mL

## 2021-12-12 ENCOUNTER — Ambulatory Visit (INDEPENDENT_AMBULATORY_CARE_PROVIDER_SITE_OTHER): Payer: MEDICARE | Admitting: Family Medicine

## 2021-12-12 ENCOUNTER — Encounter (INDEPENDENT_AMBULATORY_CARE_PROVIDER_SITE_OTHER): Payer: Self-pay

## 2021-12-12 ENCOUNTER — Telehealth (INDEPENDENT_AMBULATORY_CARE_PROVIDER_SITE_OTHER): Payer: Self-pay | Admitting: Family Medicine

## 2021-12-18 DIAGNOSIS — R350 Frequency of micturition: Secondary | ICD-10-CM | POA: Diagnosis not present

## 2021-12-18 DIAGNOSIS — R82998 Other abnormal findings in urine: Secondary | ICD-10-CM | POA: Diagnosis not present

## 2021-12-18 DIAGNOSIS — N323 Diverticulum of bladder: Secondary | ICD-10-CM | POA: Diagnosis not present

## 2021-12-18 DIAGNOSIS — R3121 Asymptomatic microscopic hematuria: Secondary | ICD-10-CM | POA: Diagnosis not present

## 2021-12-28 ENCOUNTER — Encounter: Payer: Self-pay | Admitting: Certified Registered Nurse Anesthetist

## 2021-12-29 ENCOUNTER — Other Ambulatory Visit: Payer: Self-pay | Admitting: Urology

## 2022-01-03 ENCOUNTER — Inpatient Hospital Stay (HOSPITAL_COMMUNITY): Payer: MEDICARE

## 2022-01-03 ENCOUNTER — Encounter (HOSPITAL_COMMUNITY)
Admission: EM | Disposition: A | Discharge status: Home / Self Care | Payer: Self-pay | Source: Home / Self Care | Attending: Emergency Medicine

## 2022-01-03 ENCOUNTER — Encounter: Payer: Self-pay | Admitting: Internal Medicine

## 2022-01-03 ENCOUNTER — Inpatient Hospital Stay (HOSPITAL_COMMUNITY): Payer: MEDICARE | Admitting: Certified Registered Nurse Anesthetist

## 2022-01-03 ENCOUNTER — Encounter (HOSPITAL_COMMUNITY): Payer: Self-pay | Admitting: Radiology

## 2022-01-03 ENCOUNTER — Observation Stay (HOSPITAL_COMMUNITY)
Admission: EM | Admit: 2022-01-03 | Discharge: 2022-01-04 | Disposition: A | Discharge status: Home / Self Care | Payer: MEDICARE | Attending: Internal Medicine | Admitting: Internal Medicine

## 2022-01-03 ENCOUNTER — Emergency Department (HOSPITAL_COMMUNITY): Payer: MEDICARE

## 2022-01-03 ENCOUNTER — Ambulatory Visit (AMBULATORY_SURGERY_CENTER): Payer: MEDICARE | Admitting: Internal Medicine

## 2022-01-03 ENCOUNTER — Inpatient Hospital Stay (HOSPITAL_BASED_OUTPATIENT_CLINIC_OR_DEPARTMENT_OTHER): Payer: MEDICARE | Admitting: Certified Registered Nurse Anesthetist

## 2022-01-03 VITALS — BP 175/117 | HR 71 | Temp 97.3°F | Resp 12 | Ht 63.0 in | Wt 189.0 lb

## 2022-01-03 DIAGNOSIS — T68XXXA Hypothermia, initial encounter: Secondary | ICD-10-CM | POA: Diagnosis not present

## 2022-01-03 DIAGNOSIS — D509 Iron deficiency anemia, unspecified: Secondary | ICD-10-CM | POA: Diagnosis not present

## 2022-01-03 DIAGNOSIS — K639 Disease of intestine, unspecified: Secondary | ICD-10-CM | POA: Diagnosis not present

## 2022-01-03 DIAGNOSIS — K625 Hemorrhage of anus and rectum: Secondary | ICD-10-CM

## 2022-01-03 DIAGNOSIS — K5732 Diverticulitis of large intestine without perforation or abscess without bleeding: Secondary | ICD-10-CM

## 2022-01-03 DIAGNOSIS — I10 Essential (primary) hypertension: Secondary | ICD-10-CM | POA: Insufficient documentation

## 2022-01-03 DIAGNOSIS — K6389 Other specified diseases of intestine: Secondary | ICD-10-CM

## 2022-01-03 DIAGNOSIS — Z86010 Personal history of colonic polyps: Secondary | ICD-10-CM | POA: Diagnosis not present

## 2022-01-03 DIAGNOSIS — K257 Chronic gastric ulcer without hemorrhage or perforation: Secondary | ICD-10-CM

## 2022-01-03 DIAGNOSIS — E039 Hypothyroidism, unspecified: Secondary | ICD-10-CM | POA: Insufficient documentation

## 2022-01-03 DIAGNOSIS — K8020 Calculus of gallbladder without cholecystitis without obstruction: Secondary | ICD-10-CM | POA: Diagnosis not present

## 2022-01-03 DIAGNOSIS — K449 Diaphragmatic hernia without obstruction or gangrene: Secondary | ICD-10-CM | POA: Diagnosis not present

## 2022-01-03 DIAGNOSIS — R933 Abnormal findings on diagnostic imaging of other parts of digestive tract: Secondary | ICD-10-CM

## 2022-01-03 DIAGNOSIS — K644 Residual hemorrhoidal skin tags: Secondary | ICD-10-CM | POA: Diagnosis not present

## 2022-01-03 DIAGNOSIS — R1084 Generalized abdominal pain: Secondary | ICD-10-CM | POA: Diagnosis not present

## 2022-01-03 DIAGNOSIS — K56609 Unspecified intestinal obstruction, unspecified as to partial versus complete obstruction: Principal | ICD-10-CM | POA: Diagnosis present

## 2022-01-03 DIAGNOSIS — K5939 Other megacolon: Secondary | ICD-10-CM | POA: Diagnosis not present

## 2022-01-03 DIAGNOSIS — E876 Hypokalemia: Secondary | ICD-10-CM | POA: Diagnosis not present

## 2022-01-03 DIAGNOSIS — K649 Unspecified hemorrhoids: Secondary | ICD-10-CM | POA: Diagnosis not present

## 2022-01-03 DIAGNOSIS — R000 Tachycardia, unspecified: Secondary | ICD-10-CM | POA: Diagnosis not present

## 2022-01-03 DIAGNOSIS — G4733 Obstructive sleep apnea (adult) (pediatric): Secondary | ICD-10-CM | POA: Diagnosis not present

## 2022-01-03 DIAGNOSIS — D125 Benign neoplasm of sigmoid colon: Secondary | ICD-10-CM | POA: Diagnosis not present

## 2022-01-03 DIAGNOSIS — K635 Polyp of colon: Secondary | ICD-10-CM | POA: Diagnosis not present

## 2022-01-03 DIAGNOSIS — K56699 Other intestinal obstruction unspecified as to partial versus complete obstruction: Secondary | ICD-10-CM

## 2022-01-03 DIAGNOSIS — G4730 Sleep apnea, unspecified: Secondary | ICD-10-CM

## 2022-01-03 DIAGNOSIS — R195 Other fecal abnormalities: Secondary | ICD-10-CM

## 2022-01-03 DIAGNOSIS — Z538 Procedure and treatment not carried out for other reasons: Secondary | ICD-10-CM

## 2022-01-03 DIAGNOSIS — N281 Cyst of kidney, acquired: Secondary | ICD-10-CM | POA: Diagnosis not present

## 2022-01-03 DIAGNOSIS — Z79899 Other long term (current) drug therapy: Secondary | ICD-10-CM | POA: Insufficient documentation

## 2022-01-03 DIAGNOSIS — I959 Hypotension, unspecified: Secondary | ICD-10-CM | POA: Diagnosis not present

## 2022-01-03 DIAGNOSIS — Z4682 Encounter for fitting and adjustment of non-vascular catheter: Secondary | ICD-10-CM | POA: Diagnosis not present

## 2022-01-03 DIAGNOSIS — K5730 Diverticulosis of large intestine without perforation or abscess without bleeding: Secondary | ICD-10-CM | POA: Diagnosis present

## 2022-01-03 HISTORY — PX: BOWEL DECOMPRESSION: SHX5532

## 2022-01-03 HISTORY — PX: COLONOSCOPY: SHX5424

## 2022-01-03 LAB — I-STAT CHEM 8, ED
BUN: 15 mg/dL (ref 8–23)
Calcium, Ion: 1.07 mmol/L — ABNORMAL LOW (ref 1.15–1.40)
Chloride: 105 mmol/L (ref 98–111)
Creatinine, Ser: 0.5 mg/dL (ref 0.44–1.00)
Glucose, Bld: 147 mg/dL — ABNORMAL HIGH (ref 70–99)
HCT: 39 % (ref 36.0–46.0)
Hemoglobin: 13.3 g/dL (ref 12.0–15.0)
Potassium: 3.4 mmol/L — ABNORMAL LOW (ref 3.5–5.1)
Sodium: 139 mmol/L (ref 135–145)
TCO2: 23 mmol/L (ref 22–32)

## 2022-01-03 LAB — MAGNESIUM: Magnesium: 1.8 mg/dL (ref 1.7–2.4)

## 2022-01-03 LAB — COMPREHENSIVE METABOLIC PANEL
ALT: 17 U/L (ref 0–44)
AST: 21 U/L (ref 15–41)
Albumin: 3.6 g/dL (ref 3.5–5.0)
Alkaline Phosphatase: 70 U/L (ref 38–126)
Anion gap: 8 (ref 5–15)
BUN: 15 mg/dL (ref 8–23)
CO2: 22 mmol/L (ref 22–32)
Calcium: 8.1 mg/dL — ABNORMAL LOW (ref 8.9–10.3)
Chloride: 107 mmol/L (ref 98–111)
Creatinine, Ser: 0.77 mg/dL (ref 0.44–1.00)
GFR, Estimated: >60 mL/min (ref >60)
Glucose, Bld: 152 mg/dL — ABNORMAL HIGH (ref 70–99)
Potassium: 3.4 mmol/L — ABNORMAL LOW (ref 3.5–5.1)
Sodium: 137 mmol/L (ref 135–145)
Total Bilirubin: 0.6 mg/dL (ref 0.3–1.2)
Total Protein: 6.4 g/dL — ABNORMAL LOW (ref 6.5–8.1)

## 2022-01-03 LAB — LIPASE, BLOOD: Lipase: 24 U/L (ref 11–51)

## 2022-01-03 LAB — CBC WITH DIFFERENTIAL/PLATELET
Abs Immature Granulocytes: 0.1 10*3/uL — ABNORMAL HIGH (ref 0.00–0.07)
Basophils Absolute: 0 10*3/uL (ref 0.0–0.1)
Basophils Relative: 0 %
Eosinophils Absolute: 0 10*3/uL (ref 0.0–0.5)
Eosinophils Relative: 0 %
HCT: 42.7 % (ref 36.0–46.0)
Hemoglobin: 12.5 g/dL (ref 12.0–15.0)
Immature Granulocytes: 1 %
Lymphocytes Relative: 9 %
Lymphs Abs: 0.7 10*3/uL (ref 0.7–4.0)
MCH: 23.5 pg — ABNORMAL LOW (ref 26.0–34.0)
MCHC: 29.3 g/dL — ABNORMAL LOW (ref 30.0–36.0)
MCV: 80.3 fL (ref 80.0–100.0)
Monocytes Absolute: 0.2 10*3/uL (ref 0.1–1.0)
Monocytes Relative: 3 %
Neutro Abs: 6.6 10*3/uL (ref 1.7–7.7)
Neutrophils Relative %: 87 %
Platelets: 215 10*3/uL (ref 150–400)
RBC: 5.32 MIL/uL — ABNORMAL HIGH (ref 3.87–5.11)
RDW: 17.6 % — ABNORMAL HIGH (ref 11.5–15.5)
WBC: 7.7 10*3/uL (ref 4.0–10.5)
nRBC: 0 % (ref 0.0–0.2)

## 2022-01-03 SURGERY — COLONOSCOPY
Anesthesia: Monitor Anesthesia Care

## 2022-01-03 MED ORDER — METRONIDAZOLE 500 MG/100ML IV SOLN
500.0000 mg | Freq: Two times a day (BID) | INTRAVENOUS | Status: DC
  Administered 2022-01-03 – 2022-01-04 (×2): 500 mg via INTRAVENOUS
  Filled 2022-01-03 (×2): qty 100

## 2022-01-03 MED ORDER — LACTATED RINGERS IV SOLN: INTRAVENOUS | Status: DC | PRN

## 2022-01-03 MED ORDER — ONDANSETRON HCL 4 MG/2ML IJ SOLN: 4.0000 mg | Freq: Four times a day (QID) | INTRAMUSCULAR | Status: DC | PRN

## 2022-01-03 MED ORDER — LABETALOL HCL 5 MG/ML IV SOLN: 10.0000 mg | INTRAVENOUS | Status: DC | PRN

## 2022-01-03 MED ORDER — LABETALOL HCL 5 MG/ML IV SOLN
INTRAVENOUS | Status: AC
  Filled 2022-01-03: qty 4

## 2022-01-03 MED ORDER — KCL IN DEXTROSE-NACL 40-5-0.9 MEQ/L-%-% IV SOLN
INTRAVENOUS | Status: DC
  Filled 2022-01-03 (×3): qty 1000

## 2022-01-03 MED ORDER — HYDROMORPHONE HCL 1 MG/ML IJ SOLN
0.5000 mg | Freq: Once | INTRAMUSCULAR | Status: AC
  Administered 2022-01-03: 0.5 mg via INTRAVENOUS
  Filled 2022-01-03: qty 1

## 2022-01-03 MED ORDER — SODIUM CHLORIDE 0.9 % IV SOLN: 500.0000 mL | Freq: Once | INTRAVENOUS | Status: DC

## 2022-01-03 MED ORDER — HYDRALAZINE HCL 20 MG/ML IJ SOLN
INTRAMUSCULAR | Status: DC | PRN
  Administered 2022-01-03: 10 mg via INTRAVENOUS

## 2022-01-03 MED ORDER — CIPROFLOXACIN IN D5W 400 MG/200ML IV SOLN
400.0000 mg | Freq: Two times a day (BID) | INTRAVENOUS | Status: DC
  Administered 2022-01-03 – 2022-01-04 (×2): 400 mg via INTRAVENOUS
  Filled 2022-01-03 (×2): qty 200

## 2022-01-03 MED ORDER — ONDANSETRON HCL 4 MG PO TABS: 4.0000 mg | ORAL_TABLET | Freq: Four times a day (QID) | ORAL | Status: DC | PRN

## 2022-01-03 MED ORDER — IOHEXOL 300 MG/ML  SOLN
100.0000 mL | Freq: Once | INTRAMUSCULAR | Status: AC | PRN
  Administered 2022-01-03: 100 mL via INTRAVENOUS

## 2022-01-03 MED ORDER — PROPOFOL 10 MG/ML IV BOLUS
INTRAVENOUS | Status: DC | PRN
  Administered 2022-01-03 (×5): 20 mg via INTRAVENOUS

## 2022-01-03 MED ORDER — LACTATED RINGERS IV SOLN: INTRAVENOUS | Status: DC

## 2022-01-03 MED ORDER — LABETALOL HCL 5 MG/ML IV SOLN
10.0000 mg | Freq: Once | INTRAVENOUS | Status: AC
  Administered 2022-01-03: 10 mg via INTRAVENOUS
  Filled 2022-01-03: qty 4

## 2022-01-03 MED ORDER — MORPHINE SULFATE (PF) 2 MG/ML IV SOLN
2.0000 mg | INTRAVENOUS | Status: DC | PRN
  Administered 2022-01-04: 2 mg via INTRAVENOUS
  Filled 2022-01-03: qty 1

## 2022-01-03 MED ORDER — HYDROMORPHONE HCL 1 MG/ML IJ SOLN
1.0000 mg | Freq: Once | INTRAMUSCULAR | Status: AC
  Administered 2022-01-03: 1 mg via INTRAVENOUS
  Filled 2022-01-03: qty 1

## 2022-01-03 MED ORDER — ENOXAPARIN SODIUM 40 MG/0.4ML IJ SOSY
40.0000 mg | PREFILLED_SYRINGE | Freq: Every day | INTRAMUSCULAR | Status: DC
  Administered 2022-01-03: 40 mg via SUBCUTANEOUS
  Filled 2022-01-03: qty 0.4

## 2022-01-03 MED ORDER — SODIUM CHLORIDE (PF) 0.9 % IJ SOLN
INTRAMUSCULAR | Status: AC
  Filled 2022-01-03: qty 50

## 2022-01-03 NOTE — Consult Note (Signed)
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 adult with a history of diverticulitis, iron deficiency anemia who underwent EGD and colonoscopy performed by me earlier today, and she had a distal sigmoid stricture which I biopsied.  The prep above this was poor and I did not advance the scope further.  Subsequently she had abdominal pain after that and we have determined through CT scanning that she has significant colonic distention above that area.  Etiology of the stricture is not yet clear.  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  She has been hypertensive as well did not take her medications this morning and the significant hypertension has persisted.  She is complaining of abdominal pain she has an NG tube now and is quite distended as mentioned.  She has been unable to pass flatus.  We tried a rectal tube in our endoscopy center but it was not particularly helpful.  She has not been vomiting.  The EGD demonstrated a 6 cm hiatal hernia and Cameron erosions.   Her preprocedural  and recent GI office visit HPI is as follows: HPI: Toni Jacobs is a 81 y.o. adult  with a past medical history of hypertension, hypothyroidism, vitamin D deficiency, history of diverticulosis and others listed below, returns to clinic today for evaluation of rectal bleeding.   02/18/2018 colonoscopy Dr. Carlean Purl for surveillance, personal history of adenomatous polyps, adequate bowel prep, tortuous colon.  skin tags, hemorrhoids, decreased sphincter tone, 1 sessile serrated diminutive polyp sigmoid colon and transverse, severe diverticulosis sigmoid descending and transverse, external and internal hemorrhoids. No recall due to age.   She has 3 small volume BM's a day, soft stools but formed.  States she has a hard time cleaning her stools, she has rare fecal incontinence.  She has increased urinary frequency, has seen [facility] and had CT scan that showed narrowing in the sigmoid colon suggested colonoscopy to exclude neoplasm. She has not seen any blood in the stool but had anemia with HGB 11 10/2021 and positive FOBT with Dr. Inda Merlin, he has retired now following [employer]..  She has AB pain with BM.  Denies nausea, vomiting, GERD.  Denies weight loss.  She has restless legs badly at night, gapapentin not helping. She has been on iron pills for 2-3 days.     CT from urology which showed moderate size hiatal hernia, normal stomach duodenum, small bowel normal, cecum normal ascending colon normal scattered diverticula transverse colon scar scattered diverticula descending colon proximal sigmoid colon region of  luminal nondistention over 5.8 cm segment there is stool proximal to this luminal collapse findings also appreciated on coronal image for potential mucosal shouldering along the proximal margin of the luminal collapse the distal sigmoid colon and rectum are devoid of stool due to this abrupt transition from stool filling descending colon all findings are nonspecific there are  some concern for sigmoid colorectal neoplasm.       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   Chest pain syndrome    Diarrhea    Diverticulitis    Fatty liver    GERD (gastroesophageal reflux disease)    Hypercholesteremia    Hypertension    Hypothyroidism    Impacted ear wax    Joint pain    Obesity    Osteoarthritis    Other fatigue    Palpitations    RLS (restless legs syndrome) 10/31/2020   Shortness of breath    Shortness of breath on exertion    Situational stress    Sleep apnea    no CPAP   Spasm    Thyroid disease    Tubular adenoma of colon    Vitamin D deficiency     Past Surgical History:  Procedure Laterality Date   ABDOMINAL HYSTERECTOMY  1986   APPENDECTOMY     CATARACT EXTRACTION W/ INTRAOCULAR LENS IMPLANT Bilateral 07/21/15, 09/22/15   COLONOSCOPY     TOE SURGERY     Unilateral Ooophorectomy      Family History  Problem Relation Age of Onset   Heart disease Mother    Hyperlipidemia Mother    Heart disease Father    Hypertension Father    Sudden death Father    Sleep apnea Father    Obesity Father    Hypertension Brother    Heart disease Brother    Diabetes Brother    Breast cancer Neg Hx    Colon cancer Neg Hx    Esophageal cancer Neg Hx    Rectal cancer Neg Hx    Stomach cancer Neg Hx      Social History   Tobacco Use   Smoking status: Never   Smokeless tobacco: Never  Vaping Use   Vaping Use: Never used  Substance Use Topics   Alcohol use: Yes    Alcohol/week: 1.0 standard drink of alcohol    Types: 1 Glasses of wine per week    Comment: occ   Drug use: Never    Prior to Admission medications   Medication Sig Start Date End Date Taking? Authorizing Provider  ALPRAZolam (XANAX) 0.25 MG tablet Take 0.25 mg by mouth daily as needed. 09/15/21   [provider]  amLODipine (NORVASC) 2.5 MG tablet Take 2.5 mg by mouth daily.     [provider]  gabapentin (NEURONTIN) 100 MG capsule Start with 100 mg at bedtime, can increase to 300 mg over 1-2 weeks if needed 11/15/21   Copland, Gay Filler, MD  irbesartan (AVAPRO) 300 MG tablet Take 300 mg by mouth daily.    [provider]  levothyroxine (SYNTHROID) 150 MCG tablet Take 150 mcg by mouth daily before breakfast.    [provider]  meloxicam (MOBIC) 15 MG tablet Take 15 mg by mouth daily.    [provider]  Multiple Vitamins-Minerals (MULTIVITAMIN WITH MINERALS) tablet Take 1 tablet by mouth daily.    [provider]  Vitamin  D, Ergocalciferol, (DRISDOL) 1.25 MG (50000 UNIT) CAPS capsule Take 1 capsule (50,000 Units total) by mouth every 7 (seven) days. 01/04/21   Dennard Nip D, MD    Current Facility-Administered Medications  Medication Dose Route Frequency Provider Last Rate Last Admin   sodium chloride (PF) 0.9 % injection            Current Outpatient Medications  Medication Sig Dispense Refill   ALPRAZolam (XANAX) 0.25 MG tablet Take 0.25 mg by mouth daily as needed.     amLODipine (NORVASC) 2.5 MG tablet Take 2.5 mg by mouth daily.     gabapentin (NEURONTIN) 100 MG capsule Start with 100 mg at bedtime, can increase to 300 mg over 1-2 weeks if needed 90 capsule 3   irbesartan (AVAPRO) 300 MG tablet Take 300 mg by mouth daily.     levothyroxine (SYNTHROID) 150 MCG tablet Take 150 mcg by mouth daily before breakfast.     meloxicam (MOBIC) 15 MG tablet Take 15 mg by mouth daily.     Multiple Vitamins-Minerals (MULTIVITAMIN WITH MINERALS) tablet Take 1 tablet by mouth daily.     Vitamin D, Ergocalciferol, (DRISDOL) 1.25 MG (50000 UNIT) CAPS capsule Take 1 capsule (50,000 Units total) by mouth every 7 (seven) days. 4 capsule 0    Allergies as of 01/03/2022 - Review Complete 01/03/2022  Allergen Reaction Noted   Acyclovir and related  01/01/2022   Tribenzor [olmesartan-amlodipine-hctz] Palpitations and Other (See  Comments) 10/03/2013     Review of Systems:    This is positive for those things mentioned in the HPI.      Physical Exam:  Vital signs in last 24 hours: Temp:  [97.3 F (36.3 C)-97.8 F (36.6 C)] 97.4 F (36.3 C) (11/15 1949) Pulse Rate:  [56-94] 81 (11/15 1949) Resp:  [10-35] 21 (11/15 1949) BP: (153-209)/(87-130) 171/123 (11/15 1949) SpO2:  [79 %-100 %] 91 % (11/15 1949) Weight:  [85.7 kg] 85.7 kg (11/15 1314)    General:  Elderly and somewhat chronically ill in moderate distress due to abdominal discomfort  Eyes:  anicteric. ENT:   NG tube in place  Lungs: Clear to auscultation bilaterally.-Anterior Heart:   S1S2, no rubs, murmurs, gallops. Abdomen: Very distended, no rebound or guarding, tympanitic across the upper abdomen and almost tense  Neuro:  She has been medicated with narcotics though she is alert and interacts appropriately and answers questions Psych:  appropriate mood and  Affect.   Data Reviewed:   LAB RESULTS: Recent Labs    01/03/22 1700 01/03/22 1706  WBC 7.7  --   HGB 12.5 13.3  HCT 42.7 39.0  PLT 215  --    BMET Recent Labs    01/03/22 1700 01/03/22 1706  NA 137 139  K 3.4* 3.4*  CL 107 105  CO2 22  --   GLUCOSE 152* 147*  BUN 15 15  CREATININE 0.77 0.50  CALCIUM 8.1*  --    LFT Recent Labs    01/03/22 1700  PROT 6.4*  ALBUMIN 3.6  AST 21  ALT 17  ALKPHOS 70  BILITOT 0.6    I have reviewed the images STUDIES: DG Abdomen 1 View  Result Date: 01/03/2022 CLINICAL DATA:  NG placement EXAM: ABDOMEN - 1 VIEW COMPARISON:  CT abdomen pelvis 01/04/2012 FINDINGS: NG tube enters the stomach with the tip in the body the stomach. Diffuse dilatation of the colon compatible with ileus. Contrast in the renal collecting system from prior CT. No hydronephrosis. IMPRESSION: NG  tip in the body of the stomach. Colon diffusely dilated. Electronically Signed   By: Franchot Gallo M.D.   On: 01/03/2022 17:59   CT ABDOMEN PELVIS W  CONTRAST  Result Date: 01/03/2022 CLINICAL DATA:  Abd pain acute with hypertension S/p colonoscopy today EXAM: CT ABDOMEN AND PELVIS WITH CONTRAST TECHNIQUE: Multidetector CT imaging of the abdomen and pelvis was performed using the standard protocol following bolus administration of intravenous contrast. RADIATION DOSE REDUCTION: This exam was performed according to the departmental dose-optimization program which includes automated exposure control, adjustment of the mA and/or kV according to patient size and/or use of iterative reconstruction technique. CONTRAST:  142m OMNIPAQUE IOHEXOL 300 MG/ML  SOLN COMPARISON:  None Available. FINDINGS: Lower chest: Bilateral lower lobe atelectasis. At least moderate volume hiatal hernia. Hepatobiliary: No focal liver abnormality. Calcified gallstone noted within the gallbladder lumen. No gallbladder wall thickening or pericholecystic fluid. No biliary dilatation. Pancreas: No focal lesion. Normal pancreatic contour. No surrounding inflammatory changes. No main pancreatic ductal dilatation. Spleen: Normal in size without focal abnormality. Adrenals/Urinary Tract: No adrenal nodule bilaterally. Bilateral kidneys enhance symmetrically. Simple appearing left parapelvic cysts. Simple renal cysts, in the absence of clinically indicated signs/symptoms, require no independent follow-up. No hydronephrosis. No hydroureter.  No nephroureterolithiasis. The urinary bladder is unremarkable. On delayed imaging, there is no urothelial wall thickening and there are no filling defects in the opacified portions of the bilateral collecting systems or ureters. Stomach/Bowel: Stomach is within normal limits. No evidence of small bowel wall thickening or dilatation. Large bowel wall dilatation (up to 10 cm of the cecum) with gas and fluid with transition point at the distal sigmoid colon with poorly visualized underlying mass. Scattered colonic diverticulosis. The appendix is not definitely  identified with no inflammatory changes in the right lower quadrant to suggest acute appendicitis. Vascular/Lymphatic: No abdominal aorta or iliac aneurysm. Severe atherosclerotic plaque of the aorta and its branches. No abdominal, pelvic, or inguinal lymphadenopathy. Reproductive: Status post hysterectomy. No adnexal masses. Other: Question nodularity of the peritoneum along the right pericolic gutter (39:74-16. No intraperitoneal free fluid. No intraperitoneal free gas. No organized fluid collection. Musculoskeletal: No abdominal wall hernia or abnormality. No suspicious lytic or blastic osseous lesions. No acute displaced fracture. Multilevel degenerative changes of the spine. 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 IMPRESSION: 1. Large bowel wall obstruction with transition point at the distal sigmoid colon with poorly visualized underlying mass. No findings of colitis. No bowel perforation. Recommend colonoscopy for further evaluation. 2. Question nodularity of the peritoneum along the right pericolic gutter. Recommend attention on follow-up. 3. Colonic diverticulosis with no acute diverticulitis. 4. At least moderate volume hiatal hernia. 5. Cholelithiasis with no CT finding of acute cholecystitis. 6.  Aortic Atherosclerosis (ICD10-I70.0). Electronically Signed   By: MIven FinnM.D.   On: 01/03/2022 17:33       Thanks   LOS: 0 days   '@Nathanel Tallman'$  ESimonne Maffucci MD, FAmbulatory Surgery Center At [facility]@  01/03/2022, 8:03 PM

## 2022-01-03 NOTE — ED Notes (Signed)
NG placement verified with xray. Intermittent suction at 120 started.

## 2022-01-03 NOTE — ED Provider Notes (Signed)
[facility] DEPT Provider Note   CSN: [id number] Arrival date & time: 01/03/22  1549     History {Add pertinent medical, surgical, social history, OB history to HPI:1} No chief complaint on file.   Toni Jacobs is a 81 y.o. adult.  HPI Patient presents for abdominal pain.  Medical history includes hypothyroidism, HTN, RLS, anxiety, GERD, HLD, obesity, OSA.  She was recently diagnosed with anemia and rectal bleeding with abnormal colon on CT.  Per radiology report from recent CT, there was an abrupt transition of stool in descending colon concerning for sigmoid colorectal neoplasm.  Patient was scheduled for upper and lower endoscopy today.  She did drink an entire bowel prep but had only 1 cup full of stool output.  Prior to arrival in the ED today, she did have abdominal discomfort.  She did successfully undergo upper endoscopy.  They did attempt colonoscopy but were unable to advance the scope.  Abdominal pain and distention had worsened after endoscopic.  There is concern of bowel obstruction and/or perforation.  She was sent to the ED for further evaluation.  Patient endorses generalized abdominal pain.  Family confirms that abdomen is distended.    Home Medications Prior to Admission medications   Medication Sig Start Date End Date Taking? Authorizing Provider  ALPRAZolam (XANAX) 0.25 MG tablet Take 0.25 mg by mouth daily as needed. 09/15/21   [provider]  amLODipine (NORVASC) 2.5 MG tablet Take 2.5 mg by mouth daily.    [provider]  gabapentin (NEURONTIN) 100 MG capsule Start with 100 mg at bedtime, can increase to 300 mg over 1-2 weeks if needed 11/15/21   Copland, Gay Filler, MD  irbesartan (AVAPRO) 300 MG tablet Take 300 mg by mouth daily.    [provider]  levothyroxine (SYNTHROID) 150 MCG tablet Take 150 mcg by mouth daily before breakfast.    [provider]  meloxicam (MOBIC) 15 MG tablet Take 15  mg by mouth daily. Patient not taking: Reported on 11/30/2021    [provider]  Multiple Vitamins-Minerals (MULTIVITAMIN WITH MINERALS) tablet Take 1 tablet by mouth daily.    [provider]  Vitamin D, Ergocalciferol, (DRISDOL) 1.25 MG (50000 UNIT) CAPS capsule Take 1 capsule (50,000 Units total) by mouth every 7 (seven) days. 01/04/21   Dennard Nip D, MD      Allergies    Acyclovir and related and Tribenzor [olmesartan-amlodipine-hctz]    Review of Systems   Review of Systems  Gastrointestinal:  Positive for abdominal distention and abdominal pain.  All other systems reviewed and are negative.   Physical Exam Updated Vital Signs There were no vitals taken for this visit. Physical Exam Vitals and nursing note reviewed.  Constitutional:      General: Toni Jacobs is in acute distress.     Appearance: Toni Jacobs is well-developed. Toni Jacobs is ill-appearing. Toni Jacobs is not toxic-appearing or diaphoretic.  HENT:     Head: Normocephalic and atraumatic.     Right Ear: External ear normal.     Left Ear: External ear normal.     Nose: Nose normal.  Eyes:     Conjunctiva/sclera: Conjunctivae normal.  Cardiovascular:     Rate and Rhythm: Normal rate and regular rhythm.  Pulmonary:     Effort: Pulmonary effort is normal. No respiratory distress.  Abdominal:     General: There is distension.     Tenderness: There is abdominal tenderness.  Musculoskeletal:  General: No swelling. Normal range of motion.     Cervical back: Normal range of motion and neck supple.     Right lower leg: No edema.     Left lower leg: No edema.  Skin:    General: Skin is warm and dry.     Coloration: Skin is not jaundiced or pale.  Neurological:     General: No focal deficit present.     Mental Status: Toni Jacobs is alert and oriented to person, place, and time.  Psychiatric:        Mood and Affect: Mood normal.        Behavior:  Behavior normal.     ED Results / Procedures / Treatments   Labs (all labs ordered are listed, but only abnormal results are displayed) Labs Reviewed - No data to display  EKG None  Radiology No results found.  Procedures Procedures  {Document cardiac monitor, telemetry assessment procedure when appropriate:1}  Medications Ordered in ED Medications - No data to display  ED Course/ Medical Decision Making/ A&P                           Medical Decision Making  This patient presents to the ED for concern of ***, this involves an extensive number of treatment options, and is a complaint that carries with it a high risk of complications and morbidity.  The differential diagnosis includes ***   Co morbidities that complicate the patient evaluation  ***   Additional history obtained:  Additional history obtained from *** External records from outside source obtained and reviewed including ***   Lab Tests:  I Ordered, and personally interpreted labs.  The pertinent results include:  ***   Imaging Studies ordered:  I ordered imaging studies including ***  I independently visualized and interpreted imaging which showed *** I agree with the radiologist interpretation   Cardiac Monitoring: / EKG:  The patient was maintained on a cardiac monitor.  I personally viewed and interpreted the cardiac monitored which showed an underlying rhythm of: ***   Consultations Obtained:  I requested consultation with the ***,  and discussed lab and imaging findings as well as pertinent plan - they recommend: ***   Problem List / ED Course / Critical interventions / Medication management  *** I ordered medication including ***  for ***  Reevaluation of the patient after these medicines showed that the patient {resolved/improved/worsened:23923::"improved"} I have reviewed the patients home medicines and have made adjustments as needed   Social Determinants of  Health:  ***   Test / Admission - Considered:  ***   {Document critical care time when appropriate:1} {Document review of labs and clinical decision tools ie heart score, Chads2Vasc2 etc:1}  {Document your independent review of radiology images, and any outside records:1} {Document your discussion with family members, caretakers, and with consultants:1} {Document social determinants of health affecting pt's care:1} {Document your decision making why or why not admission, treatments were needed:1} Final Clinical Impression(s) / ED Diagnoses Final diagnoses:  None    Rx / DC Orders ED Discharge Orders     None

## 2022-01-03 NOTE — Progress Notes (Signed)
911 called. Report given to triage nurse.  Dr. Carlean Purl at bedside assessing and updating family.  Patient complaints of 10 on pain scale, Elizabeth Palau CRNA administering Fentanyl 25 mg  IV. Ems here to transfer.

## 2022-01-03 NOTE — Progress Notes (Unsigned)
[facility] History and Physical   Primary Care Physician:  Darreld Mclean, MD   Reason for Procedure:   Iron deficiency anemia and abnormal colon on CT, rectal bleeding  Plan:    EGD and colonoscopy     HPI: Toni Jacobs is a 81 y.o. adult  with a past medical history of hypertension, hypothyroidism, vitamin D deficiency, history of diverticulosis and others listed below, returns to clinic today for evaluation of rectal bleeding.   02/18/2018 colonoscopy Dr. Carlean Purl for surveillance, personal history of adenomatous polyps, adequate bowel prep, tortuous colon.  skin tags, hemorrhoids, decreased sphincter tone, 1 sessile serrated diminutive polyp sigmoid colon and transverse, severe diverticulosis sigmoid descending and transverse, external and internal hemorrhoids. No recall due to age.   She has 3 small volume BM's a day, soft stools but formed.  States she has a hard time cleaning her stools, she has rare fecal incontinence.  She has increased urinary frequency, has seen [facility] and had CT scan that showed narrowing in the sigmoid colon suggested colonoscopy to exclude neoplasm. She has not seen any blood in the stool but had anemia with HGB 11 10/2021 and positive FOBT with Dr. Inda Merlin, he has retired now following [employer]..  She has AB pain with BM.  Denies nausea, vomiting, GERD.  Denies weight loss.  She has restless legs badly at night, gapapentin not helping. She has been on iron pills for 2-3 days.   CT from urology which showed moderate size hiatal hernia, normal stomach duodenum, small bowel normal, cecum normal ascending colon normal scattered diverticula transverse colon scar scattered diverticula descending colon proximal sigmoid colon region of luminal nondistention over 5.8 cm segment there is stool proximal to this luminal collapse findings also appreciated on coronal image for potential mucosal shouldering along the proximal margin  of the luminal collapse the distal sigmoid colon and rectum are devoid of stool due to this abrupt transition from stool filling descending colon all findings are nonspecific there are some concern for sigmoid colorectal neoplasm.    Past Surgical History:  Procedure Laterality Date   ABDOMINAL HYSTERECTOMY  1986   APPENDECTOMY     CATARACT EXTRACTION W/ INTRAOCULAR LENS IMPLANT Bilateral 07/21/15, 09/22/15   COLONOSCOPY     TOE SURGERY     Unilateral Ooophorectomy      Prior to Admission medications   Medication Sig Start Date End Date Taking? Authorizing Provider  amLODipine (NORVASC) 2.5 MG tablet Take 2.5 mg by mouth daily.   Yes [provider]  irbesartan (AVAPRO) 300 MG tablet Take 300 mg by mouth daily.   Yes [provider]  levothyroxine (SYNTHROID) 150 MCG tablet Take 150 mcg by mouth daily before breakfast.   Yes [provider]  Multiple Vitamins-Minerals (MULTIVITAMIN WITH MINERALS) tablet Take 1 tablet by mouth daily.   Yes [provider]  Vitamin D, Ergocalciferol, (DRISDOL) 1.25 MG (50000 UNIT) CAPS capsule Take 1 capsule (50,000 Units total) by mouth every 7 (seven) days. 01/04/21  Yes Beasley, Caren D, MD  ALPRAZolam Duanne Moron) 0.25 MG tablet Take 0.25 mg by mouth daily as needed. 09/15/21   [provider]  ciprofloxacin (CIPRO) 500 MG tablet Take 500 mg by mouth 2 (two) times daily. 11/09/21   [provider]  gabapentin (NEURONTIN) 100 MG capsule Start with 100 mg at bedtime, can increase to 300 mg over 1-2 weeks if needed 11/15/21   Copland, Gay Filler, MD  meloxicam (MOBIC) 15 MG tablet Take  15 mg by mouth daily. Patient not taking: Reported on 11/30/2021    [provider]  oxyCODONE-acetaminophen (PERCOCET) 10-325 MG tablet Take 1 tablet by mouth every 4 (four) hours as needed for pain. Patient not taking: Reported on 11/30/2021    [provider]  promethazine (PHENERGAN) 25 MG tablet Take 25 mg by  mouth every 6 (six) hours as needed for nausea or vomiting. Patient not taking: Reported on 11/30/2021    [provider]    Current Outpatient Medications  Medication Sig Dispense Refill   amLODipine (NORVASC) 2.5 MG tablet Take 2.5 mg by mouth daily.     irbesartan (AVAPRO) 300 MG tablet Take 300 mg by mouth daily.     levothyroxine (SYNTHROID) 150 MCG tablet Take 150 mcg by mouth daily before breakfast.     Multiple Vitamins-Minerals (MULTIVITAMIN WITH MINERALS) tablet Take 1 tablet by mouth daily.     Vitamin D, Ergocalciferol, (DRISDOL) 1.25 MG (50000 UNIT) CAPS capsule Take 1 capsule (50,000 Units total) by mouth every 7 (seven) days. 4 capsule 0   ALPRAZolam (XANAX) 0.25 MG tablet Take 0.25 mg by mouth daily as needed.     ciprofloxacin (CIPRO) 500 MG tablet Take 500 mg by mouth 2 (two) times daily.     gabapentin (NEURONTIN) 100 MG capsule Start with 100 mg at bedtime, can increase to 300 mg over 1-2 weeks if needed 90 capsule 3   meloxicam (MOBIC) 15 MG tablet Take 15 mg by mouth daily. (Patient not taking: Reported on 11/30/2021)     oxyCODONE-acetaminophen (PERCOCET) 10-325 MG tablet Take 1 tablet by mouth every 4 (four) hours as needed for pain. (Patient not taking: Reported on 11/30/2021)     promethazine (PHENERGAN) 25 MG tablet Take 25 mg by mouth every 6 (six) hours as needed for nausea or vomiting. (Patient not taking: Reported on 11/30/2021)     Current Facility-Administered Medications  Medication Dose Route Frequency Provider Last Rate Last Admin   0.9 %  sodium chloride infusion  500 mL Intravenous Once Gatha Mayer, MD        Allergies as of 01/03/2022 - Review Complete 01/03/2022  Allergen Reaction Noted   Acyclovir and related  01/01/2022   Tribenzor [olmesartan-amlodipine-hctz]  10/03/2013    Family History  Problem Relation Age of Onset   Heart disease Mother    Hyperlipidemia Mother    Heart disease Father    Hypertension Father    Sudden  death Father    Sleep apnea Father    Obesity Father    Hypertension Brother    Heart disease Brother    Diabetes Brother    Breast cancer Neg Hx    Colon cancer Neg Hx    Esophageal cancer Neg Hx    Rectal cancer Neg Hx    Stomach cancer Neg Hx     Social History   Socioeconomic History   Marital status: Married    Spouse name: Madelynn Done   Number of children: 1   Years of education: Not on file   Highest education level: Not on file  Occupational History    Employer: RETIRED  Tobacco Use   Smoking status: Never   Smokeless tobacco: Never  Vaping Use   Vaping Use: Never used  Substance and Sexual Activity   Alcohol use: Yes    Alcohol/week: 1.0 standard drink of alcohol    Types: 1 Glasses of wine per week    Comment: occ   Drug use: Never  Sexual activity: Not Currently  Other Topics Concern   Not on file  Social History Narrative   Retired from city of [place] and [employer]   Married, 1 daughter   Some EtOH and no drugs/EtOH   Social Determinants of Radio broadcast assistant Strain: Not on file  Food Insecurity: Not on file  Transportation Needs: Not on file  Physical Activity: Not on file  Stress: Not on file  Social Connections: Not on file  Intimate Partner Violence: Not on file    Review of Systems:  All other review of systems negative except as mentioned in the HPI.  Physical Exam: Vital signs BP (!) 201/101   Pulse 85   Temp (!) 97.3 F (36.3 C) (Temporal)   Ht '5\' 3"'$  (1.6 m)   Wt 189 lb (85.7 kg)   SpO2 96%   BMI 33.48 kg/m   General:   Alert,  Well-developed, well-nourished, pleasant and cooperative in NAD Lungs:  Clear throughout to auscultation.   Heart:  Regular rate and rhythm; no murmurs, clicks, rubs,  or gallops. Abdomen:  Soft, nontender and nondistended. Normal bowel sounds.   Neuro/Psych:  Alert and cooperative. Normal mood and affect. A and O x 3   '@Grettel Rames'$  Simonne Maffucci, MD, [facility] [phone]  (pager) 01/03/2022 2:16 PM@

## 2022-01-03 NOTE — Op Note (Signed)
[facility] Patient Name: Toni Jacobs Procedure Date: 01/03/2022 2:31 PM MRN: [id number] Endoscopist: Gatha Mayer , MD, [id number] Age: 81 Referring MD:  Date of Birth: 05-23-1940 Gender: Female Account #: [id number] Procedure:                Upper GI endoscopy Indications:              Iron deficiency anemia Medicines:                Monitored Anesthesia Care Procedure:                Pre-Anesthesia Assessment:                           - Prior to the procedure, a History and Physical                            was performed, and patient medications and                            allergies were reviewed. The patient's tolerance of                            previous anesthesia was also reviewed. The risks                            and benefits of the procedure and the sedation                            options and risks were discussed with the patient.                            All questions were answered, and informed consent                            was obtained. Prior Anticoagulants: The patient has                            taken no anticoagulant or antiplatelet agents. ASA                            Grade Assessment: II - A patient with mild systemic                            disease. After reviewing the risks and benefits,                            the patient was deemed in satisfactory condition to                            undergo the procedure.                           After obtaining informed consent, the endoscope was  passed under direct vision. Throughout the                            procedure, the patient's blood pressure, pulse, and                            oxygen saturations were monitored continuously. The                            Endoscope was introduced through the mouth, and                            advanced to the second part of duodenum. The upper                            GI endoscopy was  accomplished without difficulty.                            The patient tolerated the procedure well. Scope In: Scope Out: Findings:                 A 6 cm hiatal hernia with a few Cameron ulcers was                            found.                           The exam was otherwise without abnormality.                           The gastroesophageal flap valve was visualized                            endoscopically and classified as Hill Grade IV (no                            fold, wide open lumen, hiatal hernia present).                           The cardia and gastric fundus were otherwise normal                            on retroflexion. Complications:            No immediate complications. Estimated Blood Loss:     Estimated blood loss: none. Impression:               - 6 cm hiatal hernia with a few Cameron ulcers.                            source of anemia and iron-deficiency but she also                            has been donating blood                           -  The examination was otherwise normal.                           - Gastroesophageal flap valve classified as Hill                            Grade IV (no fold, wide open lumen, hiatal hernia                            present).                           - No specimens collected. Recommendation:           - See the other procedure note for documentation of                            additional recommendations.                           - Stop blood donation Gatha Mayer, MD 01/03/2022 3:09:07 PM This report has been signed electronically.

## 2022-01-03 NOTE — Progress Notes (Signed)
Called to room to assist during endoscopic procedure.  Patient ID and intended procedure confirmed with present staff. Received instructions for my participation in the procedure from the performing physician.  

## 2022-01-03 NOTE — Progress Notes (Unsigned)
1455 BP 193/111, Labetalol given IV, MD update, vss

## 2022-01-03 NOTE — Progress Notes (Unsigned)
1427 Robinul 0.1 mg IV given due large amount of secretions upon assessment.  MD made aware, vss

## 2022-01-03 NOTE — ED Triage Notes (Signed)
Patient BIB EMS from having Endoscopy and Colonoscopy. Had Endoscopy done and met reisistance. Sent to ER due to issues and abdominal distention. Abdomen distended and patient in pain, moaning.

## 2022-01-03 NOTE — Op Note (Signed)
[facility] Patient Name: Toni Jacobs Procedure Date: 01/03/2022 2:37 PM MRN: [id number] Endoscopist: Gatha Mayer , MD, [id number] Age: 81 Referring MD:  Date of Birth: [birth year] Gender: Female Account #: [id number] Procedure:                Colonoscopy Indications:              Rectal bleeding, Iron deficiency anemia, Abnormal                            CT of the GI tract Medicines:                Monitored Anesthesia Care Procedure:                Pre-Anesthesia Assessment:                           - Prior to the procedure, a History and Physical                            was performed, and patient medications and                            allergies were reviewed. The patient's tolerance of                            previous anesthesia was also reviewed. The risks                            and benefits of the procedure and the sedation                            options and risks were discussed with the patient.                            All questions were answered, and informed consent                            was obtained. Prior Anticoagulants: The patient has                            taken no anticoagulant or antiplatelet agents. ASA                            Grade Assessment: II - A patient with mild systemic                            disease. After reviewing the risks and benefits,                            the patient was deemed in satisfactory condition to                            undergo the procedure.  After obtaining informed consent, the colonoscope                            was passed under direct vision. Throughout the                            procedure, the patient's blood pressure, pulse, and                            oxygen saturations were monitored continuously. The                            Olympus PCF-[id number] (#[id number]) Colonoscope was                            introduced through the anus with the  intention of                            advancing to the cecum. The scope was advanced to                            the sigmoid colon before the procedure was aborted.                            Medications were given. The colonoscopy was                            performed with difficulty due to bowel stenosis and                            inadequate bowel prep. The patient tolerated the                            procedure poorly due to BP elevation and                            post-procedural pain. The quality of the bowel                            preparation was poor. The colonoscopy was aborted                            due to poor endoscopic visualization. Scope In: 2:39:17 PM Scope Out: 2:53:25 PM Total Procedure Duration: 0 hours 14 minutes 8 seconds  Findings:                 Hemorrhoids were found on perianal exam.                           A moderate stenosis was found in the distal sigmoid                            colon and was traversed. there were polypoid  changes seen - a proximal ? discrete polyp then                            more distal edema, erythema and polypoid changes -                            biopsied separately Biopsies were taken with a cold                            forceps for histology. Verification of patient                            identification for the specimen was done. Estimated                            blood loss was minimal.                           The exam was otherwise without abnormality on                            direct and retroflexion views. Complications:            Severe pain Estimated Blood Loss:     Estimated blood loss was minimal. Impression:               - Preparation of the colon was poor.                           - The procedure was aborted due to poor endoscopic                            visualization.                           - Hemorrhoids found on perianal exam.                            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 Biopsied.                            Associated polypoid and suspected inflammatory                            changes though tumor not excluded                           - The examination was otherwise normal on direct                            and retroflexion views. Recommendation:           - Transport patient to ER. She is tender in LLQ -                            abdomen soft and no  guarding but this is different                            from pre-colonoscopy exam (was "sore" but exam was                            non-tender and benign) and given the changes in the                            colon ? diverticulitis vs perforation (doubt but                            possible) - Needs CT scan and pain control, BP                            control Gatha Mayer, MD 01/03/2022 3:22:56 PM This report has been signed electronically.

## 2022-01-03 NOTE — H&P (Signed)
History and Physical    Patient: Toni Jacobs DOB: 1940/11/07 DOA: 01/03/2022 DOS: the patient was seen and examined on 01/03/2022 PCP: Copland, Gay Filler, MD  Patient coming from: Home  Chief Complaint: No chief complaint on file.  HPI: Toni Jacobs is a 81 y.o. adult with medical history significant of essential hypertension, diverticular disease, hyperlipidemia, essential hypertension, hypothyroidism, morbid obesity, restless leg syndrome, iron deficiency anemia, who apparently underwent EGD and colonoscopy today by Dr. Docia Chuck.  At that time she was found to have a distal sigmoid stricture which was biopsied.  Patient subsequently went home but came to the ER with more abdominal pain.  She was having some nausea and vomiting.  While the etiology of the stricture is not yet clear it was presumed not clear-cut malignancy by GI.  Diverticulitis was still thought to be a possible cause with chronic diverticular stricture.  Patient was seen in the ER and found to have significant abdominal distention and CT confirmed significant colonic distention above the area suspicion for large bowel obstruction.  NG tube was inserted in the ER.  GI was consulted.  Patient is therefore being admitted for further evaluation and treatment.  There is plan for decompression in the OR tonight by GI.  Review of Systems: As mentioned in the history of present illness. All other systems reviewed and are negative. 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   Chest pain syndrome    Diarrhea    Diverticulitis    Fatty liver    GERD (gastroesophageal reflux disease)    Hypercholesteremia    Hypertension    Hypothyroidism    Impacted ear wax    Joint pain    Obesity    Osteoarthritis    Other fatigue    Palpitations    RLS (restless legs syndrome) 10/31/2020   Shortness of breath     Shortness of breath on exertion    Situational stress    Sleep apnea    no CPAP   Spasm    Thyroid disease    Tubular adenoma of colon    Vitamin D deficiency    Past Surgical History:  Procedure Laterality Date   ABDOMINAL HYSTERECTOMY  1986   APPENDECTOMY     CATARACT EXTRACTION W/ INTRAOCULAR LENS IMPLANT Bilateral 07/21/15, 09/22/15   COLONOSCOPY     TOE SURGERY     Unilateral Ooophorectomy     Social History:  reports that Toni Jacobs has never smoked. Toni Jacobs has never used smokeless tobacco. Toni Jacobs reports current alcohol use of about 1.0 standard drink of alcohol per week. Toni Jacobs reports that [employer] Dowen does not use drugs.  Allergies  Allergen Reactions   Acyclovir And Related Other (See Comments)    Reaction not cited by spouse   Tribenzor [Olmesartan-Amlodipine-Hctz] Palpitations and Other (See Comments)    Heart Palpitations    Family History  Problem Relation Age of Onset   Heart disease Mother    Hyperlipidemia Mother    Heart disease Father    Hypertension Father    Sudden death Father    Sleep apnea Father    Obesity Father    Hypertension Brother    Heart disease Brother    Diabetes Brother    Breast cancer Neg Hx    Colon cancer Neg Hx  Esophageal cancer Neg Hx    Rectal cancer Neg Hx    Stomach cancer Neg Hx     Prior to Admission medications   Medication Sig Start Date End Date Taking? Authorizing Provider  ALPRAZolam Duanne Moron) 0.25 MG tablet Take 0.25 mg by mouth daily as needed for anxiety or sleep. 09/15/21  Yes [provider]  gabapentin (NEURONTIN) 100 MG capsule Start with 100 mg at bedtime, can increase to 300 mg over 1-2 weeks if needed Patient taking differently: Take 100-300 mg by mouth at bedtime as needed (for restless legs (start with 100 mg and may increase to 300 mg over 1-2 weeks, if needed)). 11/15/21  Yes Copland, Gay Filler, MD  irbesartan (AVAPRO) 300 MG tablet Take 300  mg by mouth daily.   Yes [provider]  levothyroxine (SYNTHROID) 150 MCG tablet Take 150 mcg by mouth daily before breakfast.   Yes [provider]  meloxicam (MOBIC) 15 MG tablet Take 15 mg by mouth daily as needed for pain.   Yes [provider]  Multiple Vitamins-Minerals (MULTIVITAMIN WITH MINERALS) tablet Take 1 tablet by mouth daily.   Yes [provider]  oxybutynin (DITROPAN-XL) 10 MG 24 hr tablet Take 10 mg by mouth at bedtime.   Yes [provider]  Vitamin D, Ergocalciferol, (DRISDOL) 1.25 MG (50000 UNIT) CAPS capsule Take 1 capsule (50,000 Units total) by mouth every 7 (seven) days. Patient taking differently: Take 50,000 Units by mouth every Monday. 01/04/21  Yes Beasley, Caren D, MD  amLODipine (NORVASC) 2.5 MG tablet Take 2.5 mg by mouth daily.    [provider]    Physical Exam: Vitals:   01/03/22 1845 01/03/22 1915 01/03/22 1949 01/03/22 2022  BP: (!) 158/118 (!) 158/118 (!) 171/123 (!) 186/113  Pulse: 81 84 81 78  Resp: 15 15 (!) 21 16  Temp:   (!) 97.4 F (36.3 C) (!) 97.5 F (36.4 C)  TempSrc:   Oral Oral  SpO2: (!) 79% 92% 91% 94%   Constitutional: Acutely ill looking NAD, calm, comfortable Eyes: PERRL, lids and conjunctivae normal ENMT: Mucous membranes are dry. Posterior pharynx clear of any exudate or lesions.Normal dentition.  Neck: normal, supple, no masses, no thyromegaly Respiratory: clear to auscultation bilaterally, no wheezing, no crackles. Normal respiratory effort. No accessory muscle use.  Cardiovascular: Regular rate and rhythm, no murmurs / rubs / gallops. No extremity edema. 2+ pedal pulses. No carotid bruits.  Abdomen: Distended, tympanitic, mild diffuse tenderness,, no masses palpated. No hepatosplenomegaly. Bowel sounds positive.  Musculoskeletal: Good range of motion, no joint swelling or tenderness, Skin: no rashes, lesions, ulcers. No induration Neurologic: CN 2-12 grossly intact.  Sensation intact, DTR normal. Strength 5/5 in all 4.  Psychiatric: Normal judgment and insight. Alert and oriented x 3. Normal mood  Data Reviewed:  Sodium 139, potassium 3.4 chloride 105 CO2 of 22.  Glucose 147 BUN 15 creatinine 0.50 calcium 1.07.  LFTs essentially within normal.  White count 7.7 with hemoglobin 13.3.  Platelets 215.  Acute abdominal series still with NG tube in the right place.  CT abdomen pelvis showed large bowel wall obstruction with transition point in the distal sigmoid colon with poorly visualized underlying mass no findings of colitis.  There was question of nodularity of the peritoneum along the right paracolic gutter colonic diverticulosis with no acute diverticulitis.  Assessment and Plan:  #1 large bowel obstruction: Suspected due to stricture in the sigmoid colon.  Possibly acute diverticulitis possible malignancy.  Malignancy  could be also bladder malignancy not necessarily colon.  At this point patient is going to get decompression tonight.  NG tube in place from the top.  GI already consulted.  We will continue with GI recommendations.  #2 hypokalemia: Replete potassium  #3 essential hypertension: Blood pressure has been mildly elevated.  Patient unable to take her blood pressure medicines today.  We will use IV labetalol for blood pressure control.  #4 hypothyroidism: We will resume levothyroxine when oral intake returns.  #5 history of diverticulitis: We will empirically treat for diverticulitis especially with suspicion of diverticular mass.   Advance Care Planning:   Code Status: Not on file full code  Consults: Dr. Ned Card, gastroenterology  Family Communication: Daughter and husband at bedside  Severity of Illness: The appropriate patient status for this patient is INPATIENT. Inpatient status is judged to be reasonable and necessary in order to provide the required intensity of service to ensure the patient's safety. The patient's presenting  symptoms, physical exam findings, and initial radiographic and laboratory data in the context of their chronic comorbidities is felt to place them at high risk for further clinical deterioration. Furthermore, it is not anticipated that the patient will be medically stable for discharge from the hospital within 2 midnights of admission.   * I certify that at the point of admission it is my clinical judgment that the patient will require inpatient hospital care spanning beyond 2 midnights from the point of admission due to high intensity of service, high risk for further deterioration and high frequency of surveillance required.*  AuthorBarbette Merino, MD 01/03/2022 8:32 PM  For on call review [email or web address].

## 2022-01-03 NOTE — Patient Instructions (Addendum)
The upper endoscopy exam showed a hiatal hernia (stomach moves into chest from abdominal cavity) and associated erosions that are likely leaking small amounts of blood. Most likely these and blood donation are causing the low iron and anemia.  The colonoscopy could not be completed - it is narrow in the lower colon above the rectum. It looks swollen and irritated and may be some polyps, ? Tumor. Hard to get good views. I took some biopsies and will let you know next steps.  Given your abdominal pain and elevated blood pressure, am sending you to the emergency department to be evaluated and treated.  I hope you feel better soon.  I appreciate the opportunity to care for you. Toni Mayer, MD, Marval Regal

## 2022-01-03 NOTE — Anesthesia Preprocedure Evaluation (Signed)
Anesthesia Evaluation  Patient identified by MRN, date of birth, ID bandGeneral Assessment Comment:Sleepy but arousable  Reviewed: Allergy & Precautions, NPO status , Patient's Chart, lab work & pertinent test results  History of Anesthesia Complications Negative for: history of anesthetic complications  Airway Mallampati: III  TM Distance: >3 FB Neck ROM: Full    Dental  (+) Teeth Intact, Dental Advisory Given   Pulmonary sleep apnea    breath sounds clear to auscultation       Cardiovascular hypertension, Pt. on medications  Rhythm:Regular     Neuro/Psych  PSYCHIATRIC DISORDERS Anxiety      Neuromuscular disease    GI/Hepatic Neg liver ROS,GERD  ,,S/p EDG/Colonospcopy at outpatient center with dilated colon, ileus on imaging with NG tube. Not nauseated. NPO. Somnolent from pain meds   Endo/Other  Hypothyroidism    Renal/GU      Musculoskeletal  (+) Arthritis ,    Abdominal   Peds  Hematology negative hematology ROS (+)   Anesthesia Other Findings   Reproductive/Obstetrics                             Anesthesia Physical Anesthesia Plan  ASA: 3 and emergent  Anesthesia Plan: MAC   Post-op Pain Management:    Induction: Intravenous  PONV Risk Score and Plan: 2 and Propofol infusion and Treatment may vary due to age or medical condition  Airway Management Planned: Nasal Cannula and Natural Airway  Additional Equipment: None  Intra-op Plan:   Post-operative Plan:   Informed Consent:      Dental advisory given and Consent reviewed with POA  Plan Discussed with: CRNA  Anesthesia Plan Comments:        Anesthesia Quick Evaluation

## 2022-01-03 NOTE — Progress Notes (Unsigned)
43 Pt experiencing severe abdomen pain, Dr Carlean Purl  updated.    1455 Report given to PACU, vss

## 2022-01-03 NOTE — Progress Notes (Signed)
Pharmacy Antibiotic Note  Toni Jacobs is a 81 y.o. adult admitted on 01/03/2022 with large bowel obstruction and history of diverticulitis.  Provider has ordered Flagyl 500 mg IV every 12 hours and has consulted Pharmacy for Cipro dosing.  Plan: Cipro 400 mg IV q12h Monitor clinical progress and renal function      Temp (24hrs), Avg:97.5 F (36.4 C), Min:97.3 F (36.3 C), Max:97.8 F (36.6 C)  Recent Labs  Lab 01/03/22 1700 01/03/22 1706  WBC 7.7  --   CREATININE 0.77 0.50    Estimated Creatinine Clearance (by C-G formula based on SCr of 0.5 mg/dL) Female: 59.1 mL/min Female: 72.4 mL/min    Allergies  Allergen Reactions   Acyclovir And Related Other (See Comments)    Reaction not cited by spouse   Tribenzor [Olmesartan-Amlodipine-Hctz] Palpitations and Other (See Comments)    Heart Palpitations       Thank you for allowing pharmacy to be a part of this patient's care.  Suzzanne Cloud, PharmD, BCPS 01/03/2022 8:47 PM

## 2022-01-03 NOTE — Progress Notes (Signed)
Patient complaints of abdominal pain and distension. Dr. Carlean Purl into see patient. He would like patient to be transferred to ER to be seen.

## 2022-01-03 NOTE — Transfer of Care (Signed)
Immediate Anesthesia Transfer of Care Note  Patient: RAND BOLLER  Procedure(s) Performed: COLONOSCOPY BOWEL DECOMPRESSION  Patient Location: PACU  Anesthesia Type:MAC  Level of Consciousness: awake, alert , and oriented  Airway & Oxygen Therapy: Patient Spontanous Breathing and Patient connected to nasal cannula oxygen  Post-op Assessment: Report given to RN and Post -op Vital signs reviewed and stable  Post vital signs: Reviewed and stable  Last Vitals:  Vitals Value Taken Time  BP 119/82 01/03/22 2145  Temp    Pulse 88 01/03/22 2151  Resp 22 01/03/22 2151  SpO2 93 % 01/03/22 2151  Vitals shown include unvalidated device data.  Last Pain:  Vitals:   01/03/22 2100  TempSrc: Oral  PainSc: 5          Complications: No notable events documented.

## 2022-01-03 NOTE — Progress Notes (Unsigned)
1438 BP 153/101, Labetalol given IV, MD update, vss

## 2022-01-03 NOTE — Op Note (Addendum)
[facility] Patient Name: Toni Jacobs Procedure Date: 01/03/2022 MRN: [id number] Attending MD: Gatha Mayer , MD, [id number] Date of Birth: [birth year] CSN: [id number] Age: 81 Admit Type: Emergency Department Procedure:                Colonoscopy Indications:              Generalized abdominal pain, colonic distention                            after colonoscopy earlieer today Providers:                Gatha Mayer, MD, Benay Pillow, RN, Cherylynn Ridges, Technician, Stephanie [place] CRNA Referring MD:              Medicines:                Monitored Anesthesia Care Complications:            No immediate complications. Estimated Blood Loss:     Estimated blood loss: none. Procedure:                Pre-Anesthesia Assessment:                           - Prior to the procedure, a History and Physical                            was performed, and patient medications and                            allergies were reviewed. The patient's tolerance of                            previous anesthesia was also reviewed. The risks                            and benefits of the procedure and the sedation                            options and risks were discussed with the patient.                            All questions were answered, and informed consent                            was obtained. Prior Anticoagulants: The patient has                            taken no anticoagulant or antiplatelet agents. ASA                            Grade Assessment: III - A patient with severe  systemic disease. After reviewing the risks and                            benefits, the patient was deemed in satisfactory                            condition to undergo the procedure.                           After obtaining informed consent, the colonoscope                            was passed under direct vision. Throughout the                             procedure, the patient's blood pressure, pulse, and                            oxygen saturations were monitored continuously. The                            PCF-H190TL ([id number]) Olympus slim colonoscope was                            introduced through the anus and advanced to the the                            transverse colon for evaluation. This was the                            intended extent. The colonoscopy was performed                            without difficulty. The patient tolerated the                            procedure well. The quality of the bowel                            preparation was poor. Scope In: Scope Out: Findings:      Hemorrhoids were found on perianal exam.      The prep was poor - ultraslinm colonsocope inserted through distal       sigmoid stricture as seen earlier - did not see overt signs of       malignancy again - I was able to pass to what I thought was transverse       colon. Significant thick liquid stool and dilated colon - more so as I       went more proxmal. CO2 on low. Suctioning resulted in reduced colon       diameter and signmificant decrease in abdominal distention and softer       stool.      Scope withdrawn. decompression Impression:               - Preparation of the colon was poor.                           -  Hemorrhoids found on perianal exam.                           - No specimens collected. Moderate Sedation:      Not Applicable - Patient had care per Anesthesia. Recommendation:           - Return patient to hospital ward for ongoing care.                           - Continue present medications.                           - Clamp vs DC NGT                           Observe - if feels better and abdominal distention                            remains improved then may start clears                           She was seen in recovery ad is [facility] better                           GI will f/u tomorrow Procedure  Code(s):        --- Professional ---                           [phone], 53, Colonoscopy, flexible; diagnostic,                            including collection of specimen(s) by brushing or                            washing, when performed (separate procedure) Diagnosis Code(s):        --- Professional ---                           K64.9, Unspecified hemorrhoids                           R10.84, Generalized abdominal pain CPT copyright 2022 American Medical Association. All rights reserved. The codes documented in this report are preliminary and upon coder review may  be revised to meet current compliance requirements. Gatha Mayer, MD 01/03/2022 9:44:29 PM This report has been signed electronically. Number of Addenda: 0

## 2022-01-04 ENCOUNTER — Inpatient Hospital Stay (HOSPITAL_COMMUNITY): Payer: MEDICARE

## 2022-01-04 ENCOUNTER — Telehealth: Payer: Self-pay

## 2022-01-04 DIAGNOSIS — R1084 Generalized abdominal pain: Secondary | ICD-10-CM | POA: Diagnosis not present

## 2022-01-04 DIAGNOSIS — K56699 Other intestinal obstruction unspecified as to partial versus complete obstruction: Secondary | ICD-10-CM | POA: Diagnosis not present

## 2022-01-04 DIAGNOSIS — K5732 Diverticulitis of large intestine without perforation or abscess without bleeding: Secondary | ICD-10-CM

## 2022-01-04 DIAGNOSIS — K6389 Other specified diseases of intestine: Secondary | ICD-10-CM

## 2022-01-04 DIAGNOSIS — Z4682 Encounter for fitting and adjustment of non-vascular catheter: Secondary | ICD-10-CM | POA: Diagnosis not present

## 2022-01-04 DIAGNOSIS — K56609 Unspecified intestinal obstruction, unspecified as to partial versus complete obstruction: Secondary | ICD-10-CM | POA: Diagnosis not present

## 2022-01-04 DIAGNOSIS — R140 Abdominal distension (gaseous): Secondary | ICD-10-CM | POA: Diagnosis not present

## 2022-01-04 LAB — CBC
HCT: 41 % (ref 36.0–46.0)
Hemoglobin: 12.4 g/dL (ref 12.0–15.0)
MCH: 23.8 pg — ABNORMAL LOW (ref 26.0–34.0)
MCHC: 30.2 g/dL (ref 30.0–36.0)
MCV: 78.8 fL — ABNORMAL LOW (ref 80.0–100.0)
Platelets: 230 10*3/uL (ref 150–400)
RBC: 5.2 MIL/uL — ABNORMAL HIGH (ref 3.87–5.11)
RDW: 18.3 % — ABNORMAL HIGH (ref 11.5–15.5)
WBC: 10 10*3/uL (ref 4.0–10.5)
nRBC: 0 % (ref 0.0–0.2)

## 2022-01-04 LAB — COMPREHENSIVE METABOLIC PANEL
ALT: 16 U/L (ref 0–44)
AST: 17 U/L (ref 15–41)
Albumin: 3.6 g/dL (ref 3.5–5.0)
Alkaline Phosphatase: 71 U/L (ref 38–126)
Anion gap: 8 (ref 5–15)
BUN: 15 mg/dL (ref 8–23)
CO2: 24 mmol/L (ref 22–32)
Calcium: 8.8 mg/dL — ABNORMAL LOW (ref 8.9–10.3)
Chloride: 107 mmol/L (ref 98–111)
Creatinine, Ser: 0.74 mg/dL (ref 0.44–1.00)
GFR, Estimated: >60 mL/min (ref >60)
Glucose, Bld: 135 mg/dL — ABNORMAL HIGH (ref 70–99)
Potassium: 3.7 mmol/L (ref 3.5–5.1)
Sodium: 139 mmol/L (ref 135–145)
Total Bilirubin: 0.6 mg/dL (ref 0.3–1.2)
Total Protein: 6.4 g/dL — ABNORMAL LOW (ref 6.5–8.1)

## 2022-01-04 MED ORDER — METRONIDAZOLE 500 MG PO TABS: 500.0000 mg | ORAL_TABLET | Freq: Three times a day (TID) | ORAL | 0 refills | Status: AC

## 2022-01-04 MED ORDER — POLYETHYLENE GLYCOL 3350 17 G PO PACK: 17.0000 g | PACK | Freq: Every day | ORAL | 0 refills | Status: AC

## 2022-01-04 MED ORDER — POLYETHYLENE GLYCOL 3350 17 G PO PACK: 17.0000 g | PACK | Freq: Every day | ORAL | Status: DC

## 2022-01-04 MED ORDER — CIPROFLOXACIN HCL 500 MG PO TABS: 500.0000 mg | ORAL_TABLET | Freq: Two times a day (BID) | ORAL | 0 refills | Status: DC

## 2022-01-04 NOTE — Progress Notes (Signed)
Mobility Specialist - Progress Note   01/04/22 1325  Mobility  Activity Ambulated with assistance in hallway  Level of Assistance Independent after set-up  Assistive Device None  Distance Ambulated (ft) 500 ft  Activity Response Tolerated well  Mobility Referral Yes  $Mobility charge 1 Mobility   Pt received in bed and agreed to ambulation. No c/o pain nor discomfort during session, pt returned to bed with all needs met.  Roderick Pee Mobility Specialist

## 2022-01-04 NOTE — Progress Notes (Signed)
  Transition of Care [facility]) Screening Note   Patient Details  Name: Toni Jacobs Date of Birth: 1940/06/20   Transition of Care [facility]) CM/SW Contact:    Vassie Moselle, LCSW Phone Number: 01/04/2022, 11:26 AM    Transition of Care Department [facility]) has reviewed patient and no TOC needs have been identified at this time. We will continue to monitor patient advancement through interdisciplinary progression rounds. If new patient transition needs arise, please place a TOC consult.

## 2022-01-04 NOTE — Care Management Obs Status (Signed)
[place] NOTIFICATION   Patient Details  Name: LATISE DILLEY MRN: [id number] Date of Birth: [birth year]   Medicare Observation Status Notification Given:  Yes    Vassie Moselle, LCSW 01/04/2022, 4:04 PM

## 2022-01-04 NOTE — Telephone Encounter (Signed)
Pt sent to ER yesterday afternoon from [facility] after procedure and pt was in pain with elevated bp.  PT in room 1514 at [facility], husband states she is feeling better this morning after Dr Carlean Purl did the other colonoscopy last night.  Let him know to call with any questions or concerns once she is home, verb understanding.

## 2022-01-04 NOTE — Progress Notes (Signed)
Daily Progress Note  Hospital Day: 2  Chief Complaint: colonic stricture, abdominal pain   Brief History 81 yo female with PMH of diverticulitis, IDA, hypertension, hypothyroidism, vitamin D deficiency, cholelithiasis, colon polyps, hiatal hernia.  She underwent EGD and colonoscopy yesterday for evaluation of IDA. A distal sigmoid stricture found and biopsied. Prep was poor. Procedure aborted. Subsequently had abdominal pain / distention. Sent to ED. CT scan negative for perforation but showed significant colonic stricturing of unclear etiology. Had repeat colonoscopy yesterday for decompression. No overt signs of malignancy founds   Assessment   # Iron deficiency anemia, s/p EGD / colonoscopy yesterday. Anemia possible secondary to Cameron's erosions.  Hgb stable at 12.4  # Sigmoid stricture. Admitted with abdominal pain / distention post colonoscopy  Biopsies pending. Endoscopically stricture didn't appear malignant. Maybe diverticular stricture?  Underwent decompressive colonoscopy last night. Feels okay today. No abdominal pain.    Today's KUB >> improving gaseous distention  # Hiatal hernia ( 6 cm) with Lysbeth Galas erosions  # Question of nodularity of the peritoneum along the right pericolic gutter. Recommend attention on follow-up per CT scan  # HTN. She was markedly hypertensive yesterday. BP better today at 137 / 86.   PLAN:   Discontinue NGT. She is passing a lot of flatus  Continue CLD for now Await colon biopsies Continue cipro / flagyl for ? Diverticulitis.   Subjective   NGT is bothering her throat. No N/V or abdominal pain   Objective   Endoscopic studies:  01/03/22 colonoscopy  - Preparation of the colon was poor. - The procedure was aborted due to poor endoscopic visualization. - Hemorrhoids found on perianal exam. 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 Biopsied. Associated polypoid and suspected inflammatory changes though tumor not excluded - The  examination was otherwise normal on direct and retroflexion views.  01/03/22 EGD -6 cm hiatal hernia with a few Cameron ulcers. source of anemia and iron-deficiency but she also has been donating blood - The examination was otherwise normal. - Gastroesophageal flap valve classified as Hill Grade IV (no fold, wide open lumen, hiatal hernia present). - No specimens collected.  Imaging:  DG Abdomen 1 View  Result Date: 01/03/2022 CLINICAL DATA:  NG placement EXAM: ABDOMEN - 1 VIEW COMPARISON:  CT abdomen pelvis 01/04/2012 FINDINGS: NG tube enters the stomach with the tip in the body the stomach. Diffuse dilatation of the colon compatible with ileus. Contrast in the renal collecting system from prior CT. No hydronephrosis. IMPRESSION: NG tip in the body of the stomach. Colon diffusely dilated. Electronically Signed   By: Franchot Gallo M.D.   On: 01/03/2022 17:59   CT ABDOMEN PELVIS W CONTRAST  Result Date: 01/03/2022 CLINICAL DATA:  Abd pain acute with hypertension S/p colonoscopy today EXAM: CT ABDOMEN AND PELVIS WITH CONTRAST TECHNIQUE: Multidetector CT imaging of the abdomen and pelvis was performed using the standard protocol following bolus administration of intravenous contrast. RADIATION DOSE REDUCTION: This exam was performed according to the departmental dose-optimization program which includes automated exposure control, adjustment of the mA and/or kV according to patient size and/or use of iterative reconstruction technique. CONTRAST:  151m OMNIPAQUE IOHEXOL 300 MG/ML  SOLN COMPARISON:  None Available. FINDINGS: Lower chest: Bilateral lower lobe atelectasis. At least moderate volume hiatal hernia. Hepatobiliary: No focal liver abnormality. Calcified gallstone noted within the gallbladder lumen. No gallbladder wall thickening or pericholecystic fluid. No biliary dilatation. Pancreas: No focal lesion. Normal pancreatic contour. No surrounding inflammatory changes. No main  pancreatic ductal  dilatation. Spleen: Normal in size without focal abnormality. Adrenals/Urinary Tract: No adrenal nodule bilaterally. Bilateral kidneys enhance symmetrically. Simple appearing left parapelvic cysts. Simple renal cysts, in the absence of clinically indicated signs/symptoms, require no independent follow-up. No hydronephrosis. No hydroureter.  No nephroureterolithiasis. The urinary bladder is unremarkable. On delayed imaging, there is no urothelial wall thickening and there are no filling defects in the opacified portions of the bilateral collecting systems or ureters. Stomach/Bowel: Stomach is within normal limits. No evidence of small bowel wall thickening or dilatation. Large bowel wall dilatation (up to 10 cm of the cecum) with gas and fluid with transition point at the distal sigmoid colon with poorly visualized underlying mass. Scattered colonic diverticulosis. The appendix is not definitely identified with no inflammatory changes in the right lower quadrant to suggest acute appendicitis. Vascular/Lymphatic: No abdominal aorta or iliac aneurysm. Severe atherosclerotic plaque of the aorta and its branches. No abdominal, pelvic, or inguinal lymphadenopathy. Reproductive: Status post hysterectomy. No adnexal masses. Other: Question nodularity of the peritoneum along the right pericolic gutter (1:63-84). No intraperitoneal free fluid. No intraperitoneal free gas. No organized fluid collection. Musculoskeletal: No abdominal wall hernia or abnormality. No suspicious lytic or blastic osseous lesions. No acute displaced fracture. Multilevel degenerative changes of the spine. 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 IMPRESSION: 1. Large bowel wall obstruction with transition point at the distal sigmoid colon with poorly visualized underlying mass. No findings of colitis. No bowel perforation. Recommend colonoscopy for further evaluation. 2. Question nodularity of the  peritoneum along the right pericolic gutter. Recommend attention on follow-up. 3. Colonic diverticulosis with no acute diverticulitis. 4. At least moderate volume hiatal hernia. 5. Cholelithiasis with no CT finding of acute cholecystitis. 6.  Aortic Atherosclerosis (ICD10-I70.0). Electronically Signed   By: Iven Finn M.D.   On: 01/03/2022 17:33    Lab Results: Recent Labs    01/03/22 1700 01/03/22 1706 01/04/22 0519  WBC 7.7  --  10.0  HGB 12.5 13.3 12.4  HCT 42.7 39.0 41.0  PLT 215  --  230   BMET Recent Labs    01/03/22 1700 01/03/22 1706 01/04/22 0519  NA 137 139 139  K 3.4* 3.4* 3.7  CL 107 105 107  CO2 22  --  24  GLUCOSE 152* 147* 135*  BUN '15 15 15  '$ CREATININE 0.77 0.50 0.74  CALCIUM 8.1*  --  8.8*   LFT Recent Labs    01/04/22 0519  PROT 6.4*  ALBUMIN 3.6  AST 17  ALT 16  ALKPHOS 71  BILITOT 0.6   PT/INR No results for input(s): "LABPROT", "INR" in the last 72 hours.   Scheduled inpatient medications:   enoxaparin (LOVENOX) injection  40 mg Subcutaneous QHS   Continuous inpatient infusions:   ciprofloxacin 400 mg (01/03/22 2243)   dextrose 5 % and 0.9 % NaCl with KCl 40 mEq/L 100 mL/hr at 01/04/22 0455   metronidazole Stopped (01/04/22 0014)   PRN inpatient medications: labetalol, morphine injection, ondansetron **OR** ondansetron (ZOFRAN) IV  Vital signs in last 24 hours: Temp:  [97.3 F (36.3 C)-98.2 F (36.8 C)] 98 F (36.7 C) (11/16 0526) Pulse Rate:  [56-98] 98 (11/16 0526) Resp:  [7-35] 18 (11/16 0526) BP: (109-209)/(76-130) 137/86 (11/16 0526) SpO2:  [79 %-100 %] 98 % (11/16 0526) Weight:  [85.7 kg] 85.7 kg (11/15 1314)    Intake/Output Summary (Last 24 hours) at 01/04/2022 0914 Last data filed at 01/04/2022  0455 Gross per 24 hour  Intake 1498.57 ml  Output 0 ml  Net 1498.57 ml   Intake/Output from previous day: 11/15 0701 - 11/16 0700 In: 1498.6 [I.V.:1338.6; NG/GT:60; IV Piggyback:100] Out: 0  Intake/Output this  shift: No intake/output data recorded.   Physical Exam:  General: Alert female in NAD. NGT clamped Heart:  Regular rate and rhythm. No lower extremity edema Pulmonary: Normal respiratory effort Abdomen: Soft, protuberant, mildly tympanitic. A few bowel sounds. Nontender.  Neurologic: Alert and oriented Psych: Pleasant. Cooperative.    Principal Problem:   SBO (small bowel obstruction) (HCC) Active Problems:   Hypothyroidism   Primary hypertension   Diverticulum of large intestine   Hypokalemia    LOS: 1 day   Tye Savoy ,NP 01/04/2022, 9:14 AM

## 2022-01-04 NOTE — Discharge Summary (Signed)
Physician Discharge Summary  Toni Jacobs QDI:[id number] DOB: October 01, 1940 DOA: 01/03/2022  PCP: Darreld Mclean, MD  Admit date: 01/03/2022 Discharge date: 01/04/2022  Admitted From: Endoscopy suite Disposition: Home  Recommendations for Outpatient Follow-up:  Follow up with PCP in 1-2 weeks Please obtain BMP/CBC in one week Patient will schedule follow-up.  Colon biopsy pending.  Home Health: N/A Equipment/Devices: N/A  Discharge Condition: Stable CODE STATUS: Full code Diet recommendation: Soft diet and liquid diet.  Discharge summary:  81 year old with history of diverticulitis, iron-deficiency anemia, hypertension, hypothyroidism who underwent EGD and colonoscopy yesterday at outpatient procedure to evaluate for iron deficiency anemia.  She was found to have a distal sigmoid stricture that was biopsied.  Preparation was poor and procedure was aborted.  Subsequently after the procedure, she developed significant abdominal pain and distention so she was sent to ER.  CT scan showed significant colonic stricturing with large bowel obstruction and patient was symptomatic with abdominal distention.  CT scan negative for perforation.  NG tube was placed for decompression.  Patient underwent repeat colonoscopy with decompression, able to navigate and evacuate further stool with subsequent improvement of symptoms.  Currently tolerating liquid diet with normal bowel function.  There was suspicion about diverticulitis.  We will send 7 days of ciprofloxacin and Flagyl to the pharmacy.  GI also recommended daily laxative therapy.  She will follow-up at the GI office and after biopsy and then probably refer her to colorectal surgery.  Stable for discharge.  Called in prescriptions of cipro, flagyl and miralax to her pharmacy.  Discharge Diagnoses:  Principal Problem:   SBO (small bowel obstruction) (HCC) Active Problems:   Hypothyroidism   Primary hypertension   Diverticulum of large  intestine   Hypokalemia   Colonic stricture (HCC)   Colon distention   Generalized abdominal pain   Diverticulitis of colon    Discharge Instructions  Discharge Instructions     Diet - low sodium heart healthy   Complete by: As directed    Soft and liquid diet   Increase activity slowly   Complete by: As directed       Allergies as of 01/04/2022       Reactions   Acyclovir And Related Other (See Comments)   Reaction not cited by spouse   Tribenzor [olmesartan-amlodipine-hctz] Palpitations, Other (See Comments)   Heart Palpitations        Medication List     STOP taking these medications    amLODipine 2.5 MG tablet Commonly known as: NORVASC       TAKE these medications    ALPRAZolam 0.25 MG tablet Commonly known as: XANAX Take 0.25 mg by mouth daily as needed for anxiety or sleep.   ciprofloxacin 500 MG tablet Commonly known as: Cipro Take 1 tablet (500 mg total) by mouth 2 (two) times daily for 7 days.   gabapentin 100 MG capsule Commonly known as: NEURONTIN Start with 100 mg at bedtime, can increase to 300 mg over 1-2 weeks if needed What changed:  how much to take how to take this when to take this reasons to take this additional instructions   irbesartan 300 MG tablet Commonly known as: AVAPRO Take 300 mg by mouth daily.   levothyroxine 150 MCG tablet Commonly known as: SYNTHROID Take 150 mcg by mouth daily before breakfast.   meloxicam 15 MG tablet Commonly known as: MOBIC Take 15 mg by mouth daily as needed for pain.   metroNIDAZOLE 500 MG tablet Commonly known as: Flagyl  Take 1 tablet (500 mg total) by mouth 3 (three) times daily for 7 days.   multivitamin with minerals tablet Take 1 tablet by mouth daily.   oxybutynin 10 MG 24 hr tablet Commonly known as: DITROPAN-XL Take 10 mg by mouth at bedtime.   polyethylene glycol 17 g packet Commonly known as: MIRALAX / GLYCOLAX Take 17 g by mouth daily.   Vitamin D  (Ergocalciferol) 1.25 MG (50000 UNIT) Caps capsule Commonly known as: DRISDOL Take 1 capsule (50,000 Units total) by mouth every 7 (seven) days. What changed: when to take this        Allergies  Allergen Reactions   Acyclovir And Related Other (See Comments)    Reaction not cited by spouse   Tribenzor [Olmesartan-Amlodipine-Hctz] Palpitations and Other (See Comments)    Heart Palpitations    Consultations: Gastroenterology   Procedures/Studies: DG Abd 1 View  Result Date: 01/04/2022 CLINICAL DATA:  Abdominal distension EXAM: ABDOMEN - 1 VIEW COMPARISON:  01/03/2022 FINDINGS: Distal tip of enteric tube projects within the distal stomach. Improving gaseous distension of the colon. No dilated loops of small bowel. No gross free intraperitoneal air. IMPRESSION: Improving gaseous distension of the colon. Electronically Signed   By: Davina Poke D.O.   On: 01/04/2022 09:50   DG Abdomen 1 View  Result Date: 01/03/2022 CLINICAL DATA:  NG placement EXAM: ABDOMEN - 1 VIEW COMPARISON:  CT abdomen pelvis 01/04/2012 FINDINGS: NG tube enters the stomach with the tip in the body the stomach. Diffuse dilatation of the colon compatible with ileus. Contrast in the renal collecting system from prior CT. No hydronephrosis. IMPRESSION: NG tip in the body of the stomach. Colon diffusely dilated. Electronically Signed   By: Franchot Gallo M.D.   On: 01/03/2022 17:59   CT ABDOMEN PELVIS W CONTRAST  Result Date: 01/03/2022 CLINICAL DATA:  Abd pain acute with hypertension S/p colonoscopy today EXAM: CT ABDOMEN AND PELVIS WITH CONTRAST TECHNIQUE: Multidetector CT imaging of the abdomen and pelvis was performed using the standard protocol following bolus administration of intravenous contrast. RADIATION DOSE REDUCTION: This exam was performed according to the departmental dose-optimization program which includes automated exposure control, adjustment of the mA and/or kV according to patient size and/or  use of iterative reconstruction technique. CONTRAST:  126m OMNIPAQUE IOHEXOL 300 MG/ML  SOLN COMPARISON:  None Available. FINDINGS: Lower chest: Bilateral lower lobe atelectasis. At least moderate volume hiatal hernia. Hepatobiliary: No focal liver abnormality. Calcified gallstone noted within the gallbladder lumen. No gallbladder wall thickening or pericholecystic fluid. No biliary dilatation. Pancreas: No focal lesion. Normal pancreatic contour. No surrounding inflammatory changes. No main pancreatic ductal dilatation. Spleen: Normal in size without focal abnormality. Adrenals/Urinary Tract: No adrenal nodule bilaterally. Bilateral kidneys enhance symmetrically. Simple appearing left parapelvic cysts. Simple renal cysts, in the absence of clinically indicated signs/symptoms, require no independent follow-up. No hydronephrosis. No hydroureter.  No nephroureterolithiasis. The urinary bladder is unremarkable. On delayed imaging, there is no urothelial wall thickening and there are no filling defects in the opacified portions of the bilateral collecting systems or ureters. Stomach/Bowel: Stomach is within normal limits. No evidence of small bowel wall thickening or dilatation. Large bowel wall dilatation (up to 10 cm of the cecum) with gas and fluid with transition point at the distal sigmoid colon with poorly visualized underlying mass. Scattered colonic diverticulosis. The appendix is not definitely identified with no inflammatory changes in the right lower quadrant to suggest acute appendicitis. Vascular/Lymphatic: No abdominal aorta or iliac aneurysm. Severe atherosclerotic  plaque of the aorta and its branches. No abdominal, pelvic, or inguinal lymphadenopathy. Reproductive: Status post hysterectomy. No adnexal masses. Other: Question nodularity of the peritoneum along the right pericolic gutter (7:42-59). No intraperitoneal free fluid. No intraperitoneal free gas. No organized fluid collection. Musculoskeletal:  No abdominal wall hernia or abnormality. No suspicious lytic or blastic osseous lesions. No acute displaced fracture. Multilevel degenerative changes of the spine. 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 IMPRESSION: 1. Large bowel wall obstruction with transition point at the distal sigmoid colon with poorly visualized underlying mass. No findings of colitis. No bowel perforation. Recommend colonoscopy for further evaluation. 2. Question nodularity of the peritoneum along the right pericolic gutter. Recommend attention on follow-up. 3. Colonic diverticulosis with no acute diverticulitis. 4. At least moderate volume hiatal hernia. 5. Cholelithiasis with no CT finding of acute cholecystitis. 6.  Aortic Atherosclerosis (ICD10-I70.0). Electronically Signed   By: Iven Finn M.D.   On: 01/03/2022 17:33   (Echo, Carotid, EGD, Colonoscopy, ERCP)    Subjective: Patient seen and examined.  Early morning hours she had NG tube and uncomfortable.  She was given clear liquid diet and went to bathroom frequently.  She ate full liquid diet without any issues.  Eager to go home.   Discharge Exam: Vitals:   01/04/22 0526 01/04/22 1245  BP: 137/86 (!) 161/88  Pulse: 98 71  Resp: 18 18  Temp: 98 F (36.7 C)   SpO2: 98% 97%   Vitals:   01/03/22 2210 01/04/22 0037 01/04/22 0526 01/04/22 1245  BP: 136/84 137/88 137/86 (!) 161/88  Pulse: 86 98 98 71  Resp: '15 20 18 18  '$ Temp: 98.2 F (36.8 C) 97.6 F (36.4 C) 98 F (36.7 C)   TempSrc:   Oral Oral  SpO2: 92% 94% 98% 97%    General: Pt is alert, awake, not in acute distress Cardiovascular: RRR, S1/S2 +, no rubs, no gallops Respiratory: CTA bilaterally, no wheezing, no rhonchi Abdominal: Soft, NT, ND, bowel sounds + Extremities: no edema, no cyanosis    The results of significant diagnostics from this hospitalization (including imaging, microbiology, ancillary and laboratory) are listed below for  reference.     Microbiology: No results found for this or any previous visit (from the past 240 hour(s)).   Labs: BNP (last 3 results) No results for input(s): "BNP" in the last 8760 hours. Basic Metabolic Panel: Recent Labs  Lab 01/03/22 1700 01/03/22 1706 01/04/22 0519  NA 137 139 139  K 3.4* 3.4* 3.7  CL 107 105 107  CO2 22  --  24  GLUCOSE 152* 147* 135*  BUN '15 15 15  '$ CREATININE 0.77 0.50 0.74  CALCIUM 8.1*  --  8.8*  MG 1.8  --   --    Liver Function Tests: Recent Labs  Lab 01/03/22 1700 01/04/22 0519  AST 21 17  ALT 17 16  ALKPHOS 70 71  BILITOT 0.6 0.6  PROT 6.4* 6.4*  ALBUMIN 3.6 3.6   Recent Labs  Lab 01/03/22 1700  LIPASE 24   No results for input(s): "AMMONIA" in the last 168 hours. CBC: Recent Labs  Lab 01/03/22 1700 01/03/22 1706 01/04/22 0519  WBC 7.7  --  10.0  NEUTROABS 6.6  --   --   HGB 12.5 13.3 12.4  HCT 42.7 39.0 41.0  MCV 80.3  --  78.8*  PLT 215  --  230   Cardiac Enzymes: No results for input(s): "CKTOTAL", "  CKMB", "CKMBINDEX", "TROPONINI" in the last 168 hours. BNP: Invalid input(s): "POCBNP" CBG: No results for input(s): "GLUCAP" in the last 168 hours. D-Dimer No results for input(s): "DDIMER" in the last 72 hours. Hgb A1c No results for input(s): "HGBA1C" in the last 72 hours. Lipid Profile No results for input(s): "CHOL", "HDL", "LDLCALC", "TRIG", "CHOLHDL", "LDLDIRECT" in the last 72 hours. Thyroid function studies No results for input(s): "TSH", "T4TOTAL", "T3FREE", "THYROIDAB" in the last 72 hours.  Invalid input(s): "FREET3" Anemia work up No results for input(s): "VITAMINB12", "FOLATE", "FERRITIN", "TIBC", "IRON", "RETICCTPCT" in the last 72 hours. Urinalysis No results found for: "COLORURINE", "APPEARANCEUR", "LABSPEC", "PHURINE", "GLUCOSEU", "HGBUR", "BILIRUBINUR", "KETONESUR", "PROTEINUR", "UROBILINOGEN", "NITRITE", "LEUKOCYTESUR" Sepsis Labs Recent Labs  Lab 01/03/22 1700 01/04/22 0519  WBC 7.7 10.0    Microbiology No results found for this or any previous visit (from the past 240 hour(s)).   Time coordinating discharge: 28 minutes  SIGNED:   Barb Merino, MD  [facility] 01/04/2022, 5:27 PM

## 2022-01-04 NOTE — Progress Notes (Signed)
Per Dr Jonelle Sidle, [facility] to give pt Ice chips.

## 2022-01-04 NOTE — Care Management CC44 (Signed)
Condition Code 44 Documentation Completed  Patient Details  Name: Toni Jacobs MRN: [id number] Date of Birth: 1940-06-29   Condition Code 44 given:  Yes Patient signature on Condition Code 44 notice:  Yes Documentation of 2 MD's agreement:  Yes Code 44 added to claim:  Yes    Vassie Moselle, LCSW 01/04/2022, 4:04 PM

## 2022-01-05 ENCOUNTER — Encounter (HOSPITAL_COMMUNITY): Payer: Self-pay | Admitting: Internal Medicine

## 2022-01-05 ENCOUNTER — Telehealth: Payer: Self-pay

## 2022-01-05 NOTE — Telephone Encounter (Signed)
Amlodipine was discontinued during hospitalization. Advised to monitor BP at home daily around the same time and call with increased numbers. Verbalized understanding.    Transition Care Management Follow-up Telephone Call Date of discharge and from where: Toni Jacobs, [facility] How have you been since you were released from the hospital? Pt states she is feeling better"  Any questions or concerns? No  Items Reviewed: Did the pt receive and understand the discharge instructions provided? Yes  Medications obtained and verified? Yes  Other? No  Any new allergies since your discharge? No  Dietary orders reviewed? Yes Do you have support at home? Yes   Home Care and Equipment/Supplies: Were home health services ordered? no If so, what is the name of the agency? NA  Has the agency set up a time to come to the patient's home? not applicable Were any new equipment or medical supplies ordered?  No What is the name of the medical supply agency? NA Were you able to get the supplies/equipment? not applicable Do you have any questions related to the use of the equipment or supplies? No  Functional Questionnaire: (I = Independent and D = Dependent) ADLs: I  Bathing/Dressing- I  Meal Prep- I  Eating- I  Maintaining continence- I  Transferring/Ambulation- I  Managing Meds- I  Follow up appointments reviewed:  PCP Hospital f/u appt confirmed? Yes  Scheduled to see PCP on 01/15/22 Specialist Hospital f/u appt confirmed? Advised to call GI for f/u appt.  Are transportation arrangements needed? No  If their condition worsens, is the pt aware to call PCP or go to the Emergency Dept.? Yes Was the patient provided with contact information for the PCP's office or ED? Yes Was to pt encouraged to call back with questions or concerns? Yes

## 2022-01-08 ENCOUNTER — Telehealth (INDEPENDENT_AMBULATORY_CARE_PROVIDER_SITE_OTHER): Payer: MEDICARE | Admitting: Family Medicine

## 2022-01-08 DIAGNOSIS — R0982 Postnasal drip: Secondary | ICD-10-CM | POA: Diagnosis not present

## 2022-01-08 DIAGNOSIS — U071 COVID-19: Secondary | ICD-10-CM

## 2022-01-08 DIAGNOSIS — R6883 Chills (without fever): Secondary | ICD-10-CM

## 2022-01-08 DIAGNOSIS — R051 Acute cough: Secondary | ICD-10-CM | POA: Diagnosis not present

## 2022-01-08 MED ORDER — BENZONATATE 100 MG PO CAPS: 100.0000 mg | ORAL_CAPSULE | Freq: Two times a day (BID) | ORAL | 0 refills | Status: DC | PRN

## 2022-01-08 MED ORDER — FLUTICASONE PROPIONATE 50 MCG/ACT NA SUSP: 2.0000 | Freq: Every day | NASAL | 6 refills | Status: DC

## 2022-01-08 MED ORDER — ACETAMINOPHEN ER 650 MG PO TBCR: 650.0000 mg | EXTENDED_RELEASE_TABLET | Freq: Three times a day (TID) | ORAL | Status: AC | PRN

## 2022-01-08 MED ORDER — NIRMATRELVIR/RITONAVIR (PAXLOVID)TABLET: 3.0000 | ORAL_TABLET | Freq: Two times a day (BID) | ORAL | 0 refills | Status: AC

## 2022-01-08 NOTE — Patient Instructions (Signed)
For COVID, Quarantine at home for 5 days since the start of illness. After this you, may exit quarantine, but please still wear a mask indoors and outdoors for 5 more days.   Please be sure to drink plenty of fluids.   You may take the following OTC medications to help with symptoms:  For cough, use  cough syrups or other cough suppressants.  For headache, sore throat, fevers, muscle aches, chills, other pain, take ibuprofen or tylenol  For congestion, use nasal sprays, decongestants, or antihistamines  Please follow up if no improvement.   Go to ED if you have severe chest pain, fevers, shortness of breath or other worrisome symptoms.   

## 2022-01-08 NOTE — Progress Notes (Signed)
Virtual Visit via Video Note  I connected with Toni Jacobs on 01/08/22 at  4:00 PM EST by a video enabled telemedicine application and verified that I am speaking with the correct person using two identifiers.  Location: Patient: Home Provider: Office   I discussed the limitations of evaluation and management by telemedicine and the availability of in person appointments. The patient expressed understanding and agreed to proceed.  History of Present Illness: Chief Complaint  Patient presents with   Covid Positive    Sore throat , cough , sneezing x 01/05/2022. Covid positive 01/06/2022     COVID19. Toni Jacobs complains of congestion and non productive cough.with no fever, chills, night sweats or weight loss. Onset of symptoms was  3 days ago and staying constant.Toni Jacobs is drinking plenty of fluids.  Patient husband has COVID as well and was likely the reason patient clinic.Toni Jacobs Patient tested positive for COVID  2 days ago. Treatment to date: none. Past history is significant for no history of pneumonia or bronchitis. Patient is non-smoker.  Of note patient was recently released from the hospital for SBO.  She was on ciprofloxacin but has discontinued it due to nausea.  She is not having any vomiting or diarrhea.   ROS: The patient denies chest pain, dyspnea, wheezing or hemoptysis.    Observations/Objective: There were no vitals filed for this visit.  Gen: NAD, resting comfortably HEENT: EOMI Pulm: NWOB Skin: no rash on face Neuro: no facial asymmetry or dysmetria Psych: Normal affect  Assessment and Plan:  Problem List Items Addressed This Visit       Other   COUGH   Relevant Medications   benzonatate (TESSALON) 100 MG capsule   Other Visit Diagnoses     COVID-19    -  Primary   Relevant Medications   nirmatrelvir/ritonavir EUA (PAXLOVID) 20 x 150 MG & 10 x '100MG'$  TABS   PND (post-nasal drip)       Relevant Medications   fluticasone  (FLONASE) 50 MCG/ACT nasal spray   Chills       Relevant Medications   acetaminophen (TYLENOL 8 HOUR) 650 MG CR tablet     Patient with COVID-19.  Not currently having severe symptoms.  Patient high risk due to age.  Will prescribe Paxlovid.  Also recommend supportive care with Tessalon Perles, Flonase, acetaminophen and other OTC remedies as needed.  Quarantine and return precautions discussed.  Follow Up Instructions:    I discussed the assessment and treatment plan with the patient. The patient was provided an opportunity to ask questions and all were answered. The patient agreed with the plan and demonstrated an understanding of the instructions.   The patient was advised to call back or seek an in-person evaluation if the symptoms worsen or if the condition fails to improve as anticipated.  I provided 20 minutes of non-face-to-face time during this encounter.   Toni Hollow, MD

## 2022-01-09 NOTE — Anesthesia Postprocedure Evaluation (Signed)
Anesthesia Post Note  Patient: Toni Jacobs  Procedure(s) Performed: COLONOSCOPY BOWEL DECOMPRESSION     Patient location during evaluation: Endoscopy Anesthesia Type: MAC Level of consciousness: awake and alert Pain management: pain level controlled Vital Signs Assessment: post-procedure vital signs reviewed and stable Respiratory status: spontaneous breathing, nonlabored ventilation, respiratory function stable and patient connected to nasal cannula oxygen Cardiovascular status: stable and blood pressure returned to baseline Postop Assessment: no apparent nausea or vomiting Anesthetic complications: no   No notable events documented.  Last Vitals:  Vitals:   01/04/22 0526 01/04/22 1245  BP: 137/86 (!) 161/88  Pulse: 98 71  Resp: 18 18  Temp: 36.7 C   SpO2: 98% 97%    Last Pain:  Vitals:   01/04/22 1245  TempSrc: Oral  PainSc:                  Hevin Jeffcoat

## 2022-01-11 NOTE — Progress Notes (Addendum)
[facility] [address], [place], [place] [address] [phone]  Date:  01/15/2022   Name:  Toni Jacobs   DOB:  Nov 17, 1940   MRN:  [id number]  PCP:  Darreld Mclean, MD    Chief Complaint: Hospitalization Follow-up   History of Present Illness:  Toni Jacobs is a 81 y.o. very pleasant adult patient who presents with the following:  Seen today for hospital follow-up- admitted overnight 11/15- 11/16 Discharge summary: 81 year old with history of diverticulitis, iron-deficiency anemia, hypertension, hypothyroidism who underwent EGD and colonoscopy yesterday at outpatient procedure to evaluate for iron deficiency anemia.  She was found to have a distal sigmoid stricture that was biopsied.  Preparation was poor and procedure was aborted.  Subsequently after the procedure, she developed significant abdominal pain and distention so she was sent to ER.  CT scan showed significant colonic stricturing with large bowel obstruction and patient was symptomatic with abdominal distention.  CT scan negative for perforation.  NG tube was placed for decompression.  Patient underwent repeat colonoscopy with decompression, able to navigate and evacuate further stool with subsequent improvement of symptoms.  Currently tolerating liquid diet with normal bowel function.  There was suspicion about diverticulitis.  We will send 7 days of ciprofloxacin and Flagyl to the pharmacy.  GI also recommended daily laxative therapy.  She will follow-up at the GI office and after biopsy and then probably refer her to colorectal surgery.  Stable for discharge.   Called in prescriptions of cipro, flagyl and miralax to her pharmacy.   Shingrix Covid booster Mammogram- recommend Flu shot is UTD Pt notes she also just had covid 19- she is just getting over this  Since she got home she is having BM again but not quite back to normal.  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   Colon distention 01/04/2022   Generalized abdominal pain 01/04/2022   Diverticulitis of colon 01/04/2022   SBO (small bowel obstruction) ([place]) 01/03/2022   Hypokalemia 01/03/2022   History of adenomatous polyp of colon 11/30/2021   RLS (restless legs syndrome) 10/31/2020   Vitamin D deficiency 06/30/2020   Carpal tunnel syndrome, 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   Lax, ligament 08/28/2011   VFD (visual field defect) 08/28/2011   Hypothyroidism 04/01/2009   Primary hypertension 04/01/2009   COUGH 04/01/2009   Diverticulum of large intestine 04/01/2009    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   Chest pain syndrome    Diarrhea    Diverticulitis    Fatty liver    GERD (gastroesophageal reflux disease)    Hypercholesteremia    Hypertension    Hypothyroidism    Impacted ear wax    Joint pain    Obesity    Osteoarthritis    Other fatigue    Palpitations    RLS (  restless legs syndrome) 10/31/2020   Shortness of breath    Shortness of breath on exertion    Situational stress    Sleep apnea    no CPAP   Spasm    Thyroid disease    Tubular adenoma of colon    Vitamin D deficiency     Past Surgical History:  Procedure Laterality Date   ABDOMINAL HYSTERECTOMY  1986   APPENDECTOMY     BOWEL DECOMPRESSION N/A 01/03/2022   Procedure: BOWEL DECOMPRESSION;  Surgeon: Gatha Mayer, MD;  Location: [facility];  Service:  Gastroenterology;  Laterality: N/A;   CATARACT EXTRACTION W/ INTRAOCULAR LENS IMPLANT Bilateral 07/21/15, 09/22/15   COLONOSCOPY     COLONOSCOPY N/A 01/03/2022   Procedure: COLONOSCOPY;  Surgeon: Gatha Mayer, MD;  Location: [facility];  Service: Gastroenterology;  Laterality: N/A;   TOE SURGERY     Unilateral Ooophorectomy      Social History   Tobacco Use   Smoking status: Never   Smokeless tobacco: Never  Vaping Use   Vaping Use: Never used  Substance Use Topics   Alcohol use: Yes    Alcohol/week: 1.0 standard drink of alcohol    Types: 1 Glasses of wine per week    Comment: occ   Drug use: Never    Family History  Problem Relation Age of Onset   Heart disease Mother    Hyperlipidemia Mother    Heart disease Father    Hypertension Father    Sudden death Father    Sleep apnea Father    Obesity Father    Hypertension Brother    Heart disease Brother    Diabetes Brother    Breast cancer Neg Hx    Colon cancer Neg Hx    Esophageal cancer Neg Hx    Rectal cancer Neg Hx    Stomach cancer Neg Hx     Allergies  Allergen Reactions   Acyclovir And Related Other (See Comments)    Reaction not cited by spouse   Tribenzor [Olmesartan-Amlodipine-Hctz] Palpitations and Other (See Comments)    Heart Palpitations    Medication list has been reviewed and updated.  Current Outpatient Medications on File Prior to Visit  Medication Sig Dispense Refill   acetaminophen (TYLENOL 8 HOUR) 650 MG CR tablet Take 1 tablet (650 mg total) by mouth every 8 (eight) hours as needed for pain.     ALPRAZolam (XANAX) 0.25 MG tablet Take 0.25 mg by mouth daily as needed for anxiety or sleep.     benzonatate (TESSALON) 100 MG capsule Take 1 capsule (100 mg total) by mouth 2 (two) times daily as needed for cough. 20 capsule 0   fluticasone (FLONASE) 50 MCG/ACT nasal spray Place 2 sprays into both nostrils daily. 16 g 6   gabapentin (NEURONTIN) 100 MG capsule Start with 100 mg at bedtime, can  increase to 300 mg over 1-2 weeks if needed (Patient taking differently: Take 100-300 mg by mouth at bedtime as needed (for restless legs (start with 100 mg and may increase to 300 mg over 1-2 weeks, if needed)).) 90 capsule 3   irbesartan (AVAPRO) 300 MG tablet Take 300 mg by mouth daily.     Multiple Vitamins-Minerals (MULTIVITAMIN WITH MINERALS) tablet Take 1 tablet by mouth daily.     oxybutynin (DITROPAN-XL) 10 MG 24 hr tablet Take 10 mg by mouth at bedtime.     polyethylene glycol (MIRALAX / GLYCOLAX) 17 g packet Take 17 g by mouth daily. [place]  each 0   Vitamin D, Ergocalciferol, (DRISDOL) 1.25 MG (50000 UNIT) CAPS capsule Take 1 capsule (50,000 Units total) by mouth every 7 (seven) days. (Patient taking differently: Take 50,000 Units by mouth every Monday.) 4 capsule 0   No current facility-administered medications on file prior to visit.    Review of Systems:  As per HPI- otherwise negative.   Physical Examination: Vitals:   01/15/22 1407  BP: (!) 150/106  Pulse: 66  Resp: 12  Temp: 98.1 F (36.7 C)  SpO2: 97%   Vitals:   01/15/22 1407  Weight: 182 lb 9.6 oz (82.8 kg)  Height: 5' 3.5" (1.613 m)   Body mass index is 31.84 kg/m. Ideal Body Weight: Weight in (lb) to have BMI = 25: 143.1  GEN: no acute distress.  Obese, looks well  HEENT: Atraumatic, Normocephalic.  Ears and Nose: No external deformity. CV: RRR, No M/G/R. No JVD. No thrill. No extra heart sounds. PULM: CTA B, no wheezes, crackles, rhonchi. No retractions. No resp. distress. No accessory muscle use. ABD: S, NT, ND, +BS. No rebound. No HSM.  Belly is benign  EXTR: No c/c/e PSYCH: Normally interactive. Conversant.    Assessment and Plan: Large bowel obstruction [facility])  Hospital discharge follow-up - Plan: CBC, Basic metabolic panel  Mixed hyperlipidemia  Essential hypertension - Plan: amLODipine (NORVASC) 2.5 MG tablet Following up from recent admission with LBO Feeling much better Labs pending as  above Defer colon follow-up plan to Dr Carlean Purl Add back amlodipine as her BP has come back up- pt notes no tolerance concerns with amlodipine   BP Readings from Last 3 Encounters:  01/15/22 (!) 150/100  01/04/22 (!) 161/88  01/03/22 (!) 175/117   Will plan further follow- up pending labs.   Signed Lamar Blinks, MD  Received labs as below, message to patient Results for orders placed or performed in visit on 01/15/22  CBC  Result Value Ref Range   WBC 6.9 4.0 - 10.5 K/uL   RBC 5.45 (H) 3.87 - 5.11 Mil/uL   Platelets 291.0 150.0 - 400.0 K/uL   Hemoglobin 13.1 12.0 - 15.0 g/dL   HCT 40.7 36.0 - 46.0 %   MCV 74.7 (L) 78.0 - 100.0 fl   MCHC 32.1 30.0 - 36.0 g/dL   RDW 18.9 (H) 11.5 - 55.3 %  Basic metabolic panel  Result Value Ref Range   Sodium 138 135 - 145 mEq/L   Potassium 3.8 3.5 - 5.1 mEq/L   Chloride 100 96 - 112 mEq/L   CO2 30 19 - 32 mEq/L   Glucose, Bld 94 70 - 99 mg/dL   BUN 15 6 - 23 mg/dL   Creatinine, Ser 0.77 0.40 - 1.20 mg/dL   GFR 73.18 >60.00 mL/min   Calcium 9.5 8.4 - 10.5 mg/dL

## 2022-01-15 ENCOUNTER — Encounter: Payer: Self-pay | Admitting: Family Medicine

## 2022-01-15 ENCOUNTER — Ambulatory Visit (INDEPENDENT_AMBULATORY_CARE_PROVIDER_SITE_OTHER): Payer: MEDICARE | Admitting: Family Medicine

## 2022-01-15 VITALS — BP 150/100 | HR 66 | Temp 98.1°F | Resp 12 | Ht 63.5 in | Wt 182.6 lb

## 2022-01-15 DIAGNOSIS — I10 Essential (primary) hypertension: Secondary | ICD-10-CM | POA: Diagnosis not present

## 2022-01-15 DIAGNOSIS — K56609 Unspecified intestinal obstruction, unspecified as to partial versus complete obstruction: Secondary | ICD-10-CM

## 2022-01-15 DIAGNOSIS — Z09 Encounter for follow-up examination after completed treatment for conditions other than malignant neoplasm: Secondary | ICD-10-CM

## 2022-01-15 DIAGNOSIS — E782 Mixed hyperlipidemia: Secondary | ICD-10-CM | POA: Diagnosis not present

## 2022-01-15 LAB — BASIC METABOLIC PANEL
BUN: 15 mg/dL (ref 6–23)
CO2: 30 mEq/L (ref 19–32)
Calcium: 9.5 mg/dL (ref 8.4–10.5)
Chloride: 100 mEq/L (ref 96–112)
Creatinine, Ser: 0.77 mg/dL (ref 0.40–1.20)
GFR: 73.18 mL/min (ref >60.00)
Glucose, Bld: 94 mg/dL (ref 70–99)
Potassium: 3.8 mEq/L (ref 3.5–5.1)
Sodium: 138 mEq/L (ref 135–145)

## 2022-01-15 LAB — CBC
HCT: 40.7 % (ref 36.0–46.0)
Hemoglobin: 13.1 g/dL (ref 12.0–15.0)
MCHC: 32.1 g/dL (ref 30.0–36.0)
MCV: 74.7 fl — ABNORMAL LOW (ref 78.0–100.0)
Platelets: 291 10*3/uL (ref 150.0–400.0)
RBC: 5.45 Mil/uL — ABNORMAL HIGH (ref 3.87–5.11)
RDW: 18.9 % — ABNORMAL HIGH (ref 11.5–15.5)
WBC: 6.9 10*3/uL (ref 4.0–10.5)

## 2022-01-15 NOTE — Patient Instructions (Signed)
Good to see you- I am glad you are feeling better!  We will get some basic labs for follow-up today Ok to add back the amlodipine as your BP is elevated

## 2022-01-17 ENCOUNTER — Other Ambulatory Visit: Payer: Self-pay | Admitting: *Deleted

## 2022-01-17 ENCOUNTER — Other Ambulatory Visit: Payer: MEDICARE

## 2022-01-17 DIAGNOSIS — R718 Other abnormality of red blood cells: Secondary | ICD-10-CM

## 2022-01-18 ENCOUNTER — Other Ambulatory Visit: Payer: Self-pay

## 2022-01-18 ENCOUNTER — Encounter: Payer: Self-pay | Admitting: Family Medicine

## 2022-01-18 DIAGNOSIS — Z86010 Personal history of colonic polyps: Secondary | ICD-10-CM

## 2022-01-18 DIAGNOSIS — R195 Other fecal abnormalities: Secondary | ICD-10-CM

## 2022-01-18 DIAGNOSIS — K625 Hemorrhage of anus and rectum: Secondary | ICD-10-CM

## 2022-01-18 DIAGNOSIS — D509 Iron deficiency anemia, unspecified: Secondary | ICD-10-CM

## 2022-01-18 LAB — FERRITIN: Ferritin: 24 ng/mL (ref 16–288)

## 2022-01-23 ENCOUNTER — Ambulatory Visit (HOSPITAL_BASED_OUTPATIENT_CLINIC_OR_DEPARTMENT_OTHER): Admit: 2022-01-23 | Payer: MEDICARE | Admitting: Urology

## 2022-01-23 ENCOUNTER — Encounter (HOSPITAL_BASED_OUTPATIENT_CLINIC_OR_DEPARTMENT_OTHER): Payer: Self-pay

## 2022-01-23 SURGERY — CYSTOSCOPY, WITH BIOPSY
Anesthesia: General

## 2022-02-08 DIAGNOSIS — H43813 Vitreous degeneration, bilateral: Secondary | ICD-10-CM | POA: Diagnosis not present

## 2022-02-08 DIAGNOSIS — H35363 Drusen (degenerative) of macula, bilateral: Secondary | ICD-10-CM | POA: Diagnosis not present

## 2022-02-08 DIAGNOSIS — H3554 Dystrophies primarily involving the retinal pigment epithelium: Secondary | ICD-10-CM | POA: Diagnosis not present

## 2022-02-08 DIAGNOSIS — H16223 Keratoconjunctivitis sicca, not specified as Sjogren's, bilateral: Secondary | ICD-10-CM | POA: Diagnosis not present

## 2022-02-08 DIAGNOSIS — H353131 Nonexudative age-related macular degeneration, bilateral, early dry stage: Secondary | ICD-10-CM | POA: Diagnosis not present

## 2022-02-08 DIAGNOSIS — H524 Presbyopia: Secondary | ICD-10-CM | POA: Diagnosis not present

## 2022-02-08 DIAGNOSIS — H52203 Unspecified astigmatism, bilateral: Secondary | ICD-10-CM | POA: Diagnosis not present

## 2022-02-08 DIAGNOSIS — H02834 Dermatochalasis of left upper eyelid: Secondary | ICD-10-CM | POA: Diagnosis not present

## 2022-02-08 DIAGNOSIS — H5203 Hypermetropia, bilateral: Secondary | ICD-10-CM | POA: Diagnosis not present

## 2022-02-08 DIAGNOSIS — H02831 Dermatochalasis of right upper eyelid: Secondary | ICD-10-CM | POA: Diagnosis not present

## 2022-03-06 ENCOUNTER — Encounter (HOSPITAL_COMMUNITY): Payer: Self-pay | Admitting: Internal Medicine

## 2022-03-06 ENCOUNTER — Ambulatory Visit (INDEPENDENT_AMBULATORY_CARE_PROVIDER_SITE_OTHER): Payer: MEDICARE | Admitting: Internal Medicine

## 2022-03-06 ENCOUNTER — Encounter: Payer: Self-pay | Admitting: Internal Medicine

## 2022-03-06 VITALS — BP 160/82 | HR 86 | Ht 63.5 in | Wt 182.0 lb

## 2022-03-06 DIAGNOSIS — G2581 Restless legs syndrome: Secondary | ICD-10-CM

## 2022-03-06 DIAGNOSIS — K5730 Diverticulosis of large intestine without perforation or abscess without bleeding: Secondary | ICD-10-CM | POA: Diagnosis not present

## 2022-03-06 DIAGNOSIS — K56699 Other intestinal obstruction unspecified as to partial versus complete obstruction: Secondary | ICD-10-CM

## 2022-03-06 DIAGNOSIS — D509 Iron deficiency anemia, unspecified: Secondary | ICD-10-CM

## 2022-03-06 MED ORDER — PLENVU 140 G PO SOLR: 1.0000 | ORAL | 0 refills | Status: DC

## 2022-03-06 NOTE — H&P (View-Only) (Signed)
Toni Jacobs 82 y.o. 25-Aug-1940 [id number]  Assessment & Plan:   Encounter Diagnoses  Name Primary?   Colon stricture (HCC) Yes   Diverticulosis of colon without hemorrhage    Iron deficiency anemia, unspecified iron deficiency anemia type    RLS (restless legs syndrome)    Proceed with colonoscopy at the hospital next week.  Ultraslim scope most likely and she should do better with the CO2 instead of air insufflation.  Wrap instructions given today I will have her take 4 extra doses of MiraLAX a day before prep today and then we will use a Plenvu prep.  Hold off on iron supplementation given tendency to constipation.  However I think she will need some form of therapy and repletion.  This would help her restless leg syndrome as well.  Ferritin greater than 100 is recommended in patients with restless leg syndrome.  CC: Copland, Gay Filler, MD    Subjective:   Chief Complaint: Colon stricture upcoming colonoscopy  HPI 82 year old white woman status post recent EGD and colonoscopy January 03, 2022, hiatal hernia with Cameron's erosions found (iron deficiency anemia) and colonoscopy demonstrated a stricture in the left colon and the examination was incomplete due to poor prep.  She had severe abdominal distention and hypertension and pain in recovery so we she was sent to the hospital where a CT scan showed large amount of retained air and stricturing abnormality of the left colon in the sigmoid region.  Decompressive colonoscopy was then successful and she was discharged the next day.  She is set up for a repeat colonoscopy on January 23 and is here to be reassessed and to review things prior to her examination.  She reports she is doing much better overall taking MiraLAX every day having 1 or 2 bowel movements a day.  She has not started on iron therapy for her iron deficiency.  She has been afraid to because of potential for constipation and is not clear this has been  recommended actually.  She is not having any rectal bleeding or pain.  Wt Readings from Last 3 Encounters:  03/06/22 182 lb (82.6 kg)  01/15/22 182 lb 9.6 oz (82.8 kg)  01/03/22 189 lb (85.7 kg)    Allergies  Allergen Reactions   Acyclovir And Related Other (See Comments)    Reaction not cited by spouse   Tribenzor [Olmesartan-Amlodipine-Hctz] Palpitations and Other (See Comments)    Heart Palpitations   Current Meds  Medication Sig   ciprofloxacin (CIPRO) 250 MG/5ML (5%) SUSR Take by mouth.   Multiple Vitamins-Minerals (MULTIVITAMIN WITH MINERALS) tablet Take 1 tablet by mouth daily.   polyethylene glycol (MIRALAX / GLYCOLAX) 17 g packet Take 17 g by mouth daily.   Vitamin D, Ergocalciferol, (DRISDOL) 1.25 MG (50000 UNIT) CAPS capsule Take 1 capsule (50,000 Units total) by mouth every 7 (seven) days. 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   Chest pain syndrome    Diarrhea    Diverticulitis    Fatty liver    GERD (gastroesophageal reflux disease)    Hypercholesteremia    Hypertension    Hypothyroidism    Impacted ear wax    Iron deficiency anemia    Joint pain    Large bowel obstruction (HCC)    Obesity    Osteoarthritis  Other fatigue    Palpitations    RLS (restless legs syndrome) 10/31/2020   Shortness of breath    Shortness of breath on exertion    Situational stress    Sleep apnea    no CPAP   Spasm    Thyroid disease    Tubular adenoma of colon    Vitamin D deficiency    Past Surgical History:  Procedure Laterality Date   ABDOMINAL HYSTERECTOMY  1986   APPENDECTOMY     BOWEL DECOMPRESSION N/A 01/03/2022   Procedure: BOWEL DECOMPRESSION;  Surgeon: Gatha Mayer, MD;  Location: [facility];  Service: Gastroenterology;  Laterality: N/A;   CATARACT EXTRACTION W/ INTRAOCULAR LENS  IMPLANT Bilateral 07/21/15, 09/22/15   COLONOSCOPY     COLONOSCOPY N/A 01/03/2022   Procedure: COLONOSCOPY;  Surgeon: Gatha Mayer, MD;  Location: [facility];  Service: Gastroenterology;  Laterality: N/A;   TOE SURGERY     Unilateral Ooophorectomy     Social History   Social History Narrative   Retired from city of [place] and [place]   Married, 1 daughter   Some EtOH and no drugs/EtOH   family history includes Diabetes in [place]. Hulen's brother; Heart disease in [place]. Vacca's brother, father, and mother; Hyperlipidemia in [place]. Mckeithan's mother; Hypertension in [place]. Remmers's brother and father; Obesity in [place]. Schechter's father; Sleep apnea in [place]. Albright's father; Sudden death in [place]. Tyree's father.   Review of Systems As above  Objective:   Physical Exam BP (!) 160/82   Pulse 86   Ht 5' 3.5" (1.613 m)   Wt 182 lb (82.6 kg)   BMI 31.73 kg/m  Pleasant elderly white woman in no acute distress Lungs clear Normal heart sounds Abdomen obese protuberant soft and nontender  22 minutes total time

## 2022-03-06 NOTE — Patient Instructions (Addendum)
  You have been scheduled for a colonoscopy. Please follow written instructions given to you at your visit today.  Please use the sample kit provided to you - Plenvu. If you use inhalers (even only as needed), please bring them with you on the day of your procedure.  Please do 4 doses of Miralax mixed into 32 oz's of water on Sunday afternoon for some pre-prepping per Dr Carlean Purl.   Due to recent changes in healthcare laws, you may see the results of your imaging and laboratory studies on MyChart before your provider has had a chance to review them.  We understand that in some cases there may be results that are confusing or concerning to you. Not all laboratory results come back in the same time frame and the provider may be waiting for multiple results in order to interpret others.  Please give [place] 48 hours in order for your provider to thoroughly review all the results before contacting the office for clarification of your results.    I appreciate the opportunity to care for you. [name], MD, [facility]

## 2022-03-06 NOTE — Progress Notes (Signed)
Toni Jacobs 82 y.o. 16-Nov-1940 [id number]  Assessment & Plan:   Encounter Diagnoses  Name Primary?   Colon stricture (HCC) Yes   Diverticulosis of colon without hemorrhage    Iron deficiency anemia, unspecified iron deficiency anemia type    RLS (restless legs syndrome)    Proceed with colonoscopy at the hospital next week.  Ultraslim scope most likely and she should do better with the CO2 instead of air insufflation.  Wrap instructions given today I will have her take 4 extra doses of MiraLAX a day before prep today and then we will use a Plenvu prep.  Hold off on iron supplementation given tendency to constipation.  However I think she will need some form of therapy and repletion.  This would help her restless leg syndrome as well.  Ferritin greater than 100 is recommended in patients with restless leg syndrome.  CC: Copland, Gay Filler, MD    Subjective:   Chief Complaint: Colon stricture upcoming colonoscopy  HPI 82 year old white woman status post recent EGD and colonoscopy January 03, 2022, hiatal hernia with Cameron's erosions found (iron deficiency anemia) and colonoscopy demonstrated a stricture in the left colon and the examination was incomplete due to poor prep.  She had severe abdominal distention and hypertension and pain in recovery so we she was sent to the hospital where a CT scan showed large amount of retained air and stricturing abnormality of the left colon in the sigmoid region.  Decompressive colonoscopy was then successful and she was discharged the next day.  She is set up for a repeat colonoscopy on January 23 and is here to be reassessed and to review things prior to her examination.  She reports she is doing much better overall taking MiraLAX every day having 1 or 2 bowel movements a day.  She has not started on iron therapy for her iron deficiency.  She has been afraid to because of potential for constipation and is not clear this has been  recommended actually.  She is not having any rectal bleeding or pain.  Wt Readings from Last 3 Encounters:  03/06/22 182 lb (82.6 kg)  01/15/22 182 lb 9.6 oz (82.8 kg)  01/03/22 189 lb (85.7 kg)    Allergies  Allergen Reactions   Acyclovir And Related Other (See Comments)    Reaction not cited by spouse   Tribenzor [Olmesartan-Amlodipine-Hctz] Palpitations and Other (See Comments)    Heart Palpitations   Current Meds  Medication Sig   ciprofloxacin (CIPRO) 250 MG/5ML (5%) SUSR Take by mouth.   Multiple Vitamins-Minerals (MULTIVITAMIN WITH MINERALS) tablet Take 1 tablet by mouth daily.   polyethylene glycol (MIRALAX / GLYCOLAX) 17 g packet Take 17 g by mouth daily.   Vitamin D, Ergocalciferol, (DRISDOL) 1.25 MG (50000 UNIT) CAPS capsule Take 1 capsule (50,000 Units total) by mouth every 7 (seven) days. 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   Chest pain syndrome    Diarrhea    Diverticulitis    Fatty liver    GERD (gastroesophageal reflux disease)    Hypercholesteremia    Hypertension    Hypothyroidism    Impacted ear wax    Iron deficiency anemia    Joint pain    Large bowel obstruction (HCC)    Obesity    Osteoarthritis  Other fatigue    Palpitations    RLS (restless legs syndrome) 10/31/2020   Shortness of breath    Shortness of breath on exertion    Situational stress    Sleep apnea    no CPAP   Spasm    Thyroid disease    Tubular adenoma of colon    Vitamin D deficiency    Past Surgical History:  Procedure Laterality Date   ABDOMINAL HYSTERECTOMY  1986   APPENDECTOMY     BOWEL DECOMPRESSION N/A 01/03/2022   Procedure: BOWEL DECOMPRESSION;  Surgeon: Gatha Mayer, MD;  Location: [facility];  Service: Gastroenterology;  Laterality: N/A;   CATARACT EXTRACTION W/ INTRAOCULAR LENS  IMPLANT Bilateral 07/21/15, 09/22/15   COLONOSCOPY     COLONOSCOPY N/A 01/03/2022   Procedure: COLONOSCOPY;  Surgeon: Gatha Mayer, MD;  Location: [facility];  Service: Gastroenterology;  Laterality: N/A;   TOE SURGERY     Unilateral Ooophorectomy     Social History   Social History Narrative   Retired from city of [place] and [place]   Married, 1 daughter   Some EtOH and no drugs/EtOH   family history includes Diabetes in [place]. Magar's brother; Heart disease in [place]. Fackler's brother, father, and mother; Hyperlipidemia in [place]. Large's mother; Hypertension in [place]. Blough's brother and father; Obesity in [place]. Kobler's father; Sleep apnea in [place]. Malerba's father; Sudden death in [place]. Osterhout's father.   Review of Systems As above  Objective:   Physical Exam BP (!) 160/82   Pulse 86   Ht 5' 3.5" (1.613 m)   Wt 182 lb (82.6 kg)   BMI 31.73 kg/m  Pleasant elderly white woman in no acute distress Lungs clear Normal heart sounds Abdomen obese protuberant soft and nontender  22 minutes total time

## 2022-03-12 ENCOUNTER — Telehealth: Payer: Self-pay

## 2022-03-12 ENCOUNTER — Telehealth: Payer: Self-pay | Admitting: Internal Medicine

## 2022-03-12 NOTE — Telephone Encounter (Signed)
Spoke with Pt husband Madelynn Done who stated that pt was resting most of the day, did tolerate some chicken broth and has not had any vomiting since this AM. Madelynn Done was notified that the pt could call the on call Dr. Orlene Och if she was not able to tolerate her prep tonight and need to cancel: Madelynn Done verbalized understanding with all questions answered.

## 2022-03-12 NOTE — Telephone Encounter (Signed)
Patient states she has a procedure tomorrow morning with Dr. Carlean Purl but she is sick with vomiting. Patient is requesting a nurse to call her.

## 2022-03-12 NOTE — Telephone Encounter (Signed)
Pt stated that she took 4 Capfulls of MiraLAX as part of her prep yesterday and had three episodes of vomiting last night and 1 episode this AM.   Pt has a procedure scheduled for tomorrow at [facility] with Dr. Carlean Purl. Pt notified to stay on clear liquids today, drink lots of fluids and I will give her a call back this evening at 3:00 PM.

## 2022-03-12 NOTE — Telephone Encounter (Signed)
Pt stated that she took 4 Capfulls of MiraLAX as part of her prep yesterday and had three episodes of vomiting last night and 1 episode this AM. Pt has a procedure scheduled for tomorrow at [facility] with Dr. Carlean Purl. Pt notified to stay on clear liquids today, drink lots of fluids and I will give her a call back this evening at 3:00 PM.

## 2022-03-13 ENCOUNTER — Encounter (HOSPITAL_COMMUNITY): Payer: Self-pay | Admitting: Internal Medicine

## 2022-03-13 ENCOUNTER — Encounter (HOSPITAL_COMMUNITY)
Admission: RE | Disposition: A | Discharge status: Home / Self Care | Payer: Self-pay | Source: Home / Self Care | Attending: Internal Medicine

## 2022-03-13 ENCOUNTER — Ambulatory Visit (HOSPITAL_BASED_OUTPATIENT_CLINIC_OR_DEPARTMENT_OTHER): Payer: MEDICARE | Admitting: Anesthesiology

## 2022-03-13 ENCOUNTER — Ambulatory Visit (HOSPITAL_COMMUNITY)
Admission: RE | Admit: 2022-03-13 | Discharge: 2022-03-13 | Disposition: A | Discharge status: Home / Self Care | Payer: MEDICARE | Attending: Internal Medicine | Admitting: Internal Medicine

## 2022-03-13 ENCOUNTER — Ambulatory Visit (HOSPITAL_COMMUNITY): Payer: MEDICARE | Admitting: Anesthesiology

## 2022-03-13 ENCOUNTER — Other Ambulatory Visit: Payer: Self-pay

## 2022-03-13 DIAGNOSIS — G2581 Restless legs syndrome: Secondary | ICD-10-CM | POA: Insufficient documentation

## 2022-03-13 DIAGNOSIS — K621 Rectal polyp: Secondary | ICD-10-CM | POA: Diagnosis not present

## 2022-03-13 DIAGNOSIS — I10 Essential (primary) hypertension: Secondary | ICD-10-CM | POA: Diagnosis not present

## 2022-03-13 DIAGNOSIS — D125 Benign neoplasm of sigmoid colon: Secondary | ICD-10-CM

## 2022-03-13 DIAGNOSIS — Z79899 Other long term (current) drug therapy: Secondary | ICD-10-CM | POA: Diagnosis not present

## 2022-03-13 DIAGNOSIS — E669 Obesity, unspecified: Secondary | ICD-10-CM | POA: Insufficient documentation

## 2022-03-13 DIAGNOSIS — K6289 Other specified diseases of anus and rectum: Secondary | ICD-10-CM

## 2022-03-13 DIAGNOSIS — K644 Residual hemorrhoidal skin tags: Secondary | ICD-10-CM | POA: Insufficient documentation

## 2022-03-13 DIAGNOSIS — K56699 Other intestinal obstruction unspecified as to partial versus complete obstruction: Secondary | ICD-10-CM | POA: Diagnosis present

## 2022-03-13 DIAGNOSIS — K625 Hemorrhage of anus and rectum: Secondary | ICD-10-CM

## 2022-03-13 DIAGNOSIS — G4730 Sleep apnea, unspecified: Secondary | ICD-10-CM | POA: Insufficient documentation

## 2022-03-13 DIAGNOSIS — D128 Benign neoplasm of rectum: Secondary | ICD-10-CM | POA: Diagnosis not present

## 2022-03-13 DIAGNOSIS — K648 Other hemorrhoids: Secondary | ICD-10-CM | POA: Insufficient documentation

## 2022-03-13 DIAGNOSIS — D509 Iron deficiency anemia, unspecified: Secondary | ICD-10-CM | POA: Insufficient documentation

## 2022-03-13 DIAGNOSIS — R195 Other fecal abnormalities: Secondary | ICD-10-CM

## 2022-03-13 DIAGNOSIS — K219 Gastro-esophageal reflux disease without esophagitis: Secondary | ICD-10-CM | POA: Diagnosis not present

## 2022-03-13 DIAGNOSIS — Z6831 Body mass index (BMI) 31.0-31.9, adult: Secondary | ICD-10-CM | POA: Insufficient documentation

## 2022-03-13 HISTORY — PX: POLYPECTOMY: SHX5525

## 2022-03-13 HISTORY — PX: BIOPSY: SHX5522

## 2022-03-13 HISTORY — PX: COLONOSCOPY WITH PROPOFOL: SHX5780

## 2022-03-13 SURGERY — COLONOSCOPY WITH PROPOFOL
Anesthesia: Monitor Anesthesia Care

## 2022-03-13 MED ORDER — PROPOFOL 500 MG/50ML IV EMUL
INTRAVENOUS | Status: DC | PRN
  Administered 2022-03-13: 125 ug/kg/min via INTRAVENOUS

## 2022-03-13 MED ORDER — SODIUM CHLORIDE 0.9 % IV SOLN: INTRAVENOUS | Status: DC

## 2022-03-13 MED ORDER — LIDOCAINE HCL (CARDIAC) PF 100 MG/5ML IV SOSY
PREFILLED_SYRINGE | INTRAVENOUS | Status: DC | PRN
  Administered 2022-03-13: 50 mg via INTRAVENOUS

## 2022-03-13 MED ORDER — LACTATED RINGERS IV SOLN: INTRAVENOUS | Status: DC | PRN

## 2022-03-13 MED ORDER — PROPOFOL 10 MG/ML IV BOLUS
INTRAVENOUS | Status: DC | PRN
  Administered 2022-03-13: 40 mg via INTRAVENOUS

## 2022-03-13 MED ORDER — ASPIRIN 81 MG PO TBEC: 81.0000 mg | DELAYED_RELEASE_TABLET | ORAL | 12 refills | Status: AC

## 2022-03-13 SURGICAL SUPPLY — 22 items

## 2022-03-13 NOTE — Discharge Instructions (Addendum)
I found and removed 2 small polyps that look benign and also took some biopsies in the rectum - some thickened lining seen. I did not see any cancer cut the prep was not up to par as you suspected.  Still have the narrowing as before (and was biopsied and ok the last time).   I do not think we should try again unless the biopsies/pathology suggest otherwise.  Stay on MiraLax and I will be in touch about results and plans.  I appreciate the opportunity to care for you.  Gatha Mayer, MD, FACG   YOU HAD AN ENDOSCOPIC PROCEDURE TODAY: Refer to the procedure report and other information in the discharge instructions given to you for any specific questions about what was found during the examination. If this information does not answer your questions, please call [place] office at [phone] to clarify.   YOU SHOULD EXPECT: Some feelings of bloating in the abdomen. Passage of more gas than usual. Walking can help get rid of the air that was put into your GI tract during the procedure and reduce the bloating. If you had a lower endoscopy (such as a colonoscopy or flexible sigmoidoscopy) you may notice spotting of blood in your stool or on the toilet paper. Some abdominal soreness may be present for a day or two, also.  DIET: Your first meal following the procedure should be a light meal and then it is ok to progress to your normal diet. A half-sandwich or bowl of soup is an example of a good first meal. Heavy or fried foods are harder to digest and may make you feel nauseous or bloated. Drink plenty of fluids but you should avoid alcoholic beverages for 24 hours. If you had a esophageal dilation, please see attached instructions for diet.    ACTIVITY: Your care partner should take you home directly after the procedure. You should plan to take it easy, moving slowly for the rest of the day. You can resume normal activity the day after the procedure however YOU SHOULD NOT DRIVE, use power tools,  machinery or perform tasks that involve climbing or major physical exertion for 24 hours (because of the sedation medicines used during the test).   SYMPTOMS TO REPORT IMMEDIATELY: A gastroenterologist can be reached at any hour. Please call [phone]  for any of the following symptoms:  Following lower endoscopy (colonoscopy, flexible sigmoidoscopy) Excessive amounts of blood in the stool  Significant tenderness, worsening of abdominal pains  Swelling of the abdomen that is new, acute  Fever of 100 or higher  Following upper endoscopy (EGD, EUS, ERCP, esophageal dilation) Vomiting of blood or coffee ground material  New, significant abdominal pain  New, significant chest pain or pain under the shoulder blades  Painful or persistently difficult swallowing  New shortness of breath  Black, tarry-looking or red, bloody stools  FOLLOW UP:  If any biopsies were taken you will be contacted by phone or by letter within the next 1-3 weeks. Call [phone]  if you have not heard about the biopsies in 3 weeks.  Please also call with any specific questions about appointments or follow up tests.YOU HAD AN ENDOSCOPIC PROCEDURE TODAY: Refer to the procedure report and other information in the discharge instructions given to you for any specific questions about what was found during the examination. If this information does not answer your questions, please call [place] office at [phone] to clarify.   YOU SHOULD EXPECT: Some feelings of bloating in the abdomen. Passage  of more gas than usual. Walking can help get rid of the air that was put into your GI tract during the procedure and reduce the bloating. If you had a lower endoscopy (such as a colonoscopy or flexible sigmoidoscopy) you may notice spotting of blood in your stool or on the toilet paper. Some abdominal soreness may be present for a day or two, also.  DIET: Your first meal following the procedure should be a light meal and then it is  ok to progress to your normal diet. A half-sandwich or bowl of soup is an example of a good first meal. Heavy or fried foods are harder to digest and may make you feel nauseous or bloated. Drink plenty of fluids but you should avoid alcoholic beverages for 24 hours. If you had a esophageal dilation, please see attached instructions for diet.    ACTIVITY: Your care partner should take you home directly after the procedure. You should plan to take it easy, moving slowly for the rest of the day. You can resume normal activity the day after the procedure however YOU SHOULD NOT DRIVE, use power tools, machinery or perform tasks that involve climbing or major physical exertion for 24 hours (because of the sedation medicines used during the test).   SYMPTOMS TO REPORT IMMEDIATELY: A gastroenterologist can be reached at any hour. Please call [phone]  for any of the following symptoms:  Following lower endoscopy (colonoscopy, flexible sigmoidoscopy) Excessive amounts of blood in the stool  Significant tenderness, worsening of abdominal pains  Swelling of the abdomen that is new, acute  Fever of 100 or higher  Following upper endoscopy (EGD, EUS, ERCP, esophageal dilation) Vomiting of blood or coffee ground material  New, significant abdominal pain  New, significant chest pain or pain under the shoulder blades  Painful or persistently difficult swallowing  New shortness of breath  Black, tarry-looking or red, bloody stools  FOLLOW UP:  If any biopsies were taken you will be contacted by phone or by letter within the next 1-3 weeks. Call [phone]  if you have not heard about the biopsies in 3 weeks.  Please also call with any specific questions about appointments or follow up tests.

## 2022-03-13 NOTE — Interval H&P Note (Signed)
History and Physical Interval Note:  03/13/2022 8:46 AM  Toni Jacobs  has presented today for surgery, with the diagnosis of IDA.  The various methods of treatment have been discussed with the patient and family. After consideration of risks, benefits and other options for treatment, the patient has consented to  Procedure(s): COLONOSCOPY WITH PROPOFOL (N/A) as a surgical intervention.  The patient's history has been reviewed, patient examined, no change in status, stable for surgery.  I have reviewed the patient's chart and labs.  Questions were answered to the patient's satisfaction.     [name]

## 2022-03-13 NOTE — Op Note (Signed)
[facility] Patient Name: Toni Jacobs Procedure Date: 03/13/2022 MRN: [id number] Attending MD: Gatha Mayer , MD, [id number] Date of Birth: [birth year] CSN: [id number] Age: 82 Admit Type: Outpatient Procedure:                Colonoscopy Indications:              Iron deficiency anemia Providers:                Gatha Mayer, MD, Jaci Carrel, RN, Cherylynn Ridges, Technician, Arnoldo Hooker, CRNA Referring MD:              Medicines:                Monitored Anesthesia Care Complications:            No immediate complications. Estimated Blood Loss:     Estimated blood loss was minimal. Procedure:                Pre-Anesthesia Assessment:                           - Prior to the procedure, a History and Physical                            was performed, and patient medications and                            allergies were reviewed. The patient's tolerance of                            previous anesthesia was also reviewed. The risks                            and benefits of the procedure and the sedation                            options and risks were discussed with the patient.                            All questions were answered, and informed consent                            was obtained. Prior Anticoagulants: The patient has                            taken no anticoagulant or antiplatelet agents. ASA                            Grade Assessment: III - A patient with severe                            systemic disease. After reviewing the risks and  benefits, the patient was deemed in satisfactory                            condition to undergo the procedure.                           After obtaining informed consent, the colonoscope                            was passed under direct vision. Throughout the                            procedure, the patient's blood pressure, pulse, and                             oxygen saturations were monitored continuously. The                            PCF-H190TL ([id number] Olympus slim colonoscope was                            introduced through the anus and advanced to the the                            cecum, identified by appendiceal orifice and                            ileocecal valve. The colonoscopy was somewhat                            difficult due to poor endoscopic visualization.                            Successful completion of the procedure was aided by                            lavage. The colonoscopy was somewhat difficult due                            to a tortuous colon. The patient tolerated the                            procedure well. The quality of the bowel                            preparation was fair. The ileocecal valve,                            appendiceal orifice, and rectum were photographed.                            The bowel preparation used was Plenvu via extended  prep 9had 4 doses MiraLax on day before Plenvu                            prep) with split dose instruction. Scope In: 9:01:47 AM Scope Out: 9:49:22 AM Scope Withdrawal Time: 0 hours 32 minutes 34 seconds  Total Procedure Duration: 0 hours 47 minutes 35 seconds  Findings:      Hemorrhoids were found on perianal exam.      Two sessile polyps were found in the rectum and distal sigmoid colon.       The polyps were 5 to 7 mm in size. These polyps were removed with a hot       snare. Resection and retrieval were complete. Verification of patient       identification for the specimen was done. Estimated blood loss: none.      A localized area of congested and erythematous mucosa was found in the       rectum. Biopsies were taken with a cold forceps for histology.       Verification of patient identification for the specimen was done.       Estimated blood loss was minimal.      A benign-appearing, intrinsic moderate stenosis  was found in the distal       sigmoid colon and was traversed.      External and internal hemorrhoids were found.      The exam was otherwise without abnormality on direct and retroflexion       views. Impression:               - Preparation of the colon was fair.                           - Hemorrhoids found on perianal exam.                           - Two 5 to 7 mm polyps in the rectum and in the                            distal sigmoid colon, removed with a hot snare.                            Resected and retrieved.                           - Congested and erythematous mucosa in the rectum.                            Biopsied.                           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 As noted                            recently (this procedure was done in hospital w/                            ultraslim scoe and CO2 insufflation due to severe  air trapping on last exam                           - External and internal hemorrhoids.                           - The examination was otherwise normal on direct                            and retroflexion views. Moderate Sedation:      Not Applicable - Patient had care per Anesthesia. Recommendation:           - Patient has a contact number available for                            emergencies. The signs and symptoms of potential                            delayed complications were discussed with the                            patient. Return to normal activities tomorrow.                            Written discharge instructions were provided to the                            patient.                           - Resume previous diet.                           - Continue present medications. Stay on MiraLax                            every day                           - No aspirin, ibuprofen, naproxen, or other                            non-steroidal anti-inflammatory drugs for 2 weeks                             after polyp removal.                           - Await pathology results. I am not inclined to                            repeat colonoscopy given difficulty of prepping and                            completing the exam - will discuss after pathology  review Procedure Code(s):        --- Professional ---                           [phone], Colonoscopy, flexible; with removal of                            tumor(s), polyp(s), or other lesion(s) by snare                            technique                           45380, 101, Colonoscopy, flexible; with biopsy,                            single or multiple Diagnosis Code(s):        --- Professional ---                           K64.8, Other hemorrhoids                           D12.8, Benign neoplasm of rectum                           D12.5, Benign neoplasm of sigmoid colon                           K62.89, Other specified diseases of anus and rectum                           K56.699, Other intestinal obstruction unspecified                            as to partial versus complete obstruction                           D50.9, Iron deficiency anemia, unspecified CPT copyright 2022 American [employer] Association. All rights reserved. The codes documented in this report are preliminary and upon coder review may  be revised to meet current compliance requirements. Gatha Mayer, MD 03/13/2022 10:06:04 AM This report has been signed electronically. Number of Addenda: 0

## 2022-03-13 NOTE — Anesthesia Postprocedure Evaluation (Signed)
Anesthesia Post Note  Patient: Toni Jacobs  Procedure(s) Performed: COLONOSCOPY WITH PROPOFOL POLYPECTOMY BIOPSY     Patient location during evaluation: PACU Anesthesia Type: MAC Level of consciousness: awake and alert and oriented Pain management: pain level controlled Vital Signs Assessment: post-procedure vital signs reviewed and stable Respiratory status: spontaneous breathing, nonlabored ventilation and respiratory function stable Cardiovascular status: stable and blood pressure returned to baseline Postop Assessment: no apparent nausea or vomiting Anesthetic complications: no   No notable events documented.  Last Vitals:  Vitals:   03/13/22 0955 03/13/22 1000  BP: (!) 175/97 (!) 175/90  Pulse: 89 88  Resp: 18 17  Temp:    SpO2: 100% 97%    Last Pain:  Vitals:   03/13/22 1000  TempSrc:   PainSc: 0-No pain                 Jennalynn Rivard A.

## 2022-03-13 NOTE — Anesthesia Preprocedure Evaluation (Signed)
Anesthesia Evaluation  Patient identified by MRN, date of birth, ID band Patient awake    Reviewed: Allergy & Precautions, NPO status , Patient's Chart, lab work & pertinent test results, reviewed documented beta blocker date and time   Airway Mallampati: II       Dental no notable dental hx. (+) Caps, Dental Advisory Given   Pulmonary shortness of breath and with exertion, sleep apnea    Pulmonary exam normal breath sounds clear to auscultation       Cardiovascular hypertension, Pt. on medications Normal cardiovascular exam Rhythm:Regular Rate:Normal     Neuro/Psych   Anxiety      Neuromuscular disease    GI/Hepatic Neg liver ROS,GERD  Medicated,,  Endo/Other  Hypothyroidism  Hypothyroidism Obesity  Renal/GU negative Renal ROS Bladder dysfunction      Musculoskeletal  (+) Arthritis , Osteoarthritis,    Abdominal  (+) + obese  Peds  Hematology  (+) Blood dyscrasia, anemia   Anesthesia Other Findings   Reproductive/Obstetrics                             Anesthesia Physical Anesthesia Plan  ASA: 2  Anesthesia Plan: MAC   Post-op Pain Management: Minimal or no pain anticipated   Induction: Intravenous  PONV Risk Score and Plan: 2 and Treatment may vary due to age or medical condition and Propofol infusion  Airway Management Planned: Natural Airway and Nasal Cannula  Additional Equipment: None  Intra-op Plan:   Post-operative Plan:   Informed Consent: I have reviewed the patients History and Physical, chart, labs and discussed the procedure including the risks, benefits and alternatives for the proposed anesthesia with the patient or authorized representative who has indicated his/her understanding and acceptance.     Dental advisory given  Plan Discussed with: Anesthesiologist  Anesthesia Plan Comments:        Anesthesia Quick Evaluation

## 2022-03-13 NOTE — Transfer of Care (Signed)
Immediate Anesthesia Transfer of Care Note  Patient: Toni Jacobs  Procedure(s) Performed: Procedure(s): COLONOSCOPY WITH PROPOFOL (N/A) POLYPECTOMY BIOPSY  Patient Location: PACU  Anesthesia Type:MAC  Level of Consciousness:  sedated, patient cooperative and responds to stimulation  Airway & Oxygen Therapy:Patient Spontanous Breathing and Patient connected to face mask oxgen  Post-op Assessment:  Report given to PACU RN and Post -op Vital signs reviewed and stable  Post vital signs:  Reviewed and stable  Last Vitals:  Vitals:   03/13/22 0734  BP: 134/85  Resp: 16  Temp: 36.8 C  SpO2: 83%    Complications: No apparent anesthesia complications

## 2022-03-14 ENCOUNTER — Encounter (HOSPITAL_COMMUNITY): Payer: Self-pay | Admitting: Internal Medicine

## 2022-03-16 LAB — SURGICAL PATHOLOGY

## 2022-03-20 ENCOUNTER — Other Ambulatory Visit (HOSPITAL_COMMUNITY): Payer: Self-pay

## 2022-03-28 NOTE — Telephone Encounter (Signed)
error 

## 2022-03-29 ENCOUNTER — Telehealth: Payer: Self-pay | Admitting: Family Medicine

## 2022-03-29 ENCOUNTER — Encounter: Payer: Self-pay | Admitting: Family Medicine

## 2022-03-29 DIAGNOSIS — E039 Hypothyroidism, unspecified: Secondary | ICD-10-CM

## 2022-03-29 MED ORDER — LEVOTHYROXINE SODIUM 150 MCG PO TABS: 150.0000 ug | ORAL_TABLET | Freq: Every day | ORAL | 1 refills | Status: DC

## 2022-03-29 NOTE — Telephone Encounter (Signed)
Prescription Request  03/29/2022  Is this a "Controlled Substance" medicine? No  LOV: 01/15/2022  What is the name of the medication or equipment?  levothyroxine (SYNTHROID) 150 MCG tablet   Have you contacted your pharmacy to request a refill? Yes  Which pharmacy would you like this sent to?  [facility]   Patient notified that their request is being sent to the clinical staff for review and that they should receive a response within 2 business days.   Please advise at [place]

## 2022-03-29 NOTE — Telephone Encounter (Signed)
I do not see where we have ever written this for her.

## 2022-04-17 NOTE — Progress Notes (Signed)
[facility] [place], [place], [place] [address] [phone]  Date:  04/23/2022   Name:  Toni Jacobs   DOB:  1940-05-10   MRN:  [id number]  PCP:  Darreld Mclean, MD    Chief Complaint: weight and thyroid (Pt would like to discuss wegovy & have her thyroid checked today/Concerns/ questions: Discuss BP meds since her pressure is high all the time)   History of Present Illness:  Toni Jacobs is a 82 y.o. very pleasant adult patient who presents with the following:  Patient is seen today to discuss GLP-1 medication and follow-up on her thyroid History of hypothyroidism, restless leg syndrome, small bowel obstruction in the past with more recent colonic stricture, diverticulitis  I last saw her in November after she had been admitted overnight with a large bowel obstruction She recently underwent a colonoscopy per Dr. Carlean Purl, about 1 month ago for follow-up-she did have a stricture in the distal sigmoid colon as well as a couple of polyps She is seeing him again on 3/12- she is not sure if GI will want her to see a surgeon She is taking miralax daily- this seems to be working ok   We briefly discussed a GLP-1 drug today.  Because she does not have diabetes it is unlikely to be covered.  Also, with her history of bowel obstruction I do not think this is a great idea for her.  She states understanding  They have noted her BP being too high recently She has been on the same BP meds for many years She has noted her BP being a bit high for "about 2 years"- it does seem like when she exercises her BP will be better  Recommend shingles vaccine at her pharmacy  BP Readings from Last 3 Encounters:  04/23/22 (!) 150/92  03/13/22 (!) 156/96  03/06/22 (!) 160/82   Alprazolam Amlodipine-not taking currently Aspirin Avapro 300 Levothyroxine 150 Oxybutynin  Lab Results  Component Value Date   TSH 3.710  05/31/2020    Patient Active Problem List   Diagnosis Date Noted   Benign neoplasm of rectum 03/13/2022   Benign neoplasm of sigmoid colon 03/13/2022   Colon stricture ([place]) 01/04/2022   Colon distention 01/04/2022   Generalized abdominal pain 01/04/2022   Diverticulitis of colon 01/04/2022   SBO (small bowel obstruction) ([place]) 01/03/2022   Hypokalemia 01/03/2022   History of adenomatous polyp of colon 11/30/2021   RLS (restless legs syndrome) 10/31/2020   Vitamin D deficiency 06/30/2020   Carpal tunnel syndrome, 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   Lax, ligament 08/28/2011   VFD (visual field defect) 08/28/2011   Hypothyroidism 04/01/2009   Primary hypertension 04/01/2009   COUGH 04/01/2009   Diverticulum of large intestine 04/01/2009    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   Chest pain syndrome    COVID    Diarrhea    Diverticulitis    Fatty liver    GERD (gastroesophageal reflux disease)    Hypercholesteremia    Hypertension    Hypothyroidism    Impacted ear wax    Iron deficiency anemia    Joint pain    Large bowel obstruction (HCC)    Obesity    Osteoarthritis    Other fatigue  Palpitations    RLS (restless legs syndrome) 10/31/2020   Shortness of breath    Shortness of breath on exertion    Situational stress    Sleep apnea    no CPAP   Spasm    Thyroid disease    Tubular adenoma of colon    Vitamin D deficiency     Past Surgical History:  Procedure Laterality Date   ABDOMINAL HYSTERECTOMY  1986   APPENDECTOMY     BIOPSY  03/13/2022   Procedure: BIOPSY;  Surgeon: Gatha Mayer, MD;  Location: [facility];  Service: Gastroenterology;;   BOWEL DECOMPRESSION N/A 01/03/2022   Procedure: BOWEL DECOMPRESSION;  Surgeon: Gatha Mayer, MD;  Location: [facility];  Service: Gastroenterology;  Laterality:  N/A;   CATARACT EXTRACTION W/ INTRAOCULAR LENS IMPLANT Bilateral 07/21/15, 09/22/15   COLONOSCOPY     COLONOSCOPY N/A 01/03/2022   Procedure: COLONOSCOPY;  Surgeon: Gatha Mayer, MD;  Location: [facility];  Service: Gastroenterology;  Laterality: N/A;   COLONOSCOPY WITH PROPOFOL N/A 03/13/2022   Procedure: COLONOSCOPY WITH PROPOFOL;  Surgeon: Gatha Mayer, MD;  Location: [facility];  Service: Gastroenterology;  Laterality: N/A;   POLYPECTOMY  03/13/2022   Procedure: POLYPECTOMY;  Surgeon: Gatha Mayer, MD;  Location: [facility];  Service: Gastroenterology;;   TOE SURGERY     Unilateral Ooophorectomy      Social History   Tobacco Use   Smoking status: Never   Smokeless tobacco: Never  Vaping Use   Vaping Use: Never used  Substance Use Topics   Alcohol use: Yes    Alcohol/week: 1.0 standard drink of alcohol    Types: 1 Glasses of wine per week    Comment: occ   Drug use: Never    Family History  Problem Relation Age of Onset   Heart disease Mother    Hyperlipidemia Mother    Heart disease Father    Hypertension Father    Sudden death Father    Sleep apnea Father    Obesity Father    Hypertension Brother    Heart disease Brother    Diabetes Brother    Breast cancer Neg Hx    Colon cancer Neg Hx    Esophageal cancer Neg Hx    Rectal cancer Neg Hx    Stomach cancer Neg Hx     Allergies  Allergen Reactions   Acyclovir And Related Other (See Comments)    Reaction not cited by spouse   Tribenzor [Olmesartan-Amlodipine-Hctz] Palpitations and Other (See Comments)    Medication list has been reviewed and updated.  Current Outpatient Medications on File Prior to Visit  Medication Sig Dispense Refill   acetaminophen (TYLENOL 8 HOUR) 650 MG CR tablet Take 1 tablet (650 mg total) by mouth every 8 (eight) hours as needed for pain.     ALPRAZolam (XANAX) 0.25 MG tablet Take 0.25 mg by mouth daily as needed for anxiety or sleep.     aspirin EC 81 MG tablet Take 1  tablet (81 mg total) by mouth once a week. Swallow whole. Do not take again until 03/27/22 30 tablet 12   Biotin 10 MG CAPS Take 10 mg by mouth 3 (three) times a week.     CALCIUM PO Take 1 tablet by mouth daily as needed (restless legs).     Ferrous Sulfate (IRON PO) Take 1 tablet by mouth daily as needed (restless legs).     fluticasone (FLONASE) 50 MCG/ACT nasal spray Place 2 sprays into  both nostrils daily. 16 g 6   gabapentin (NEURONTIN) 100 MG capsule Start with 100 mg at bedtime, can increase to 300 mg over 1-2 weeks if needed 90 capsule 3   irbesartan (AVAPRO) 300 MG tablet Take 300 mg by mouth daily as needed (high blood pressure).     levothyroxine (SYNTHROID) 150 MCG tablet Take 1 tablet (150 mcg total) by mouth daily before breakfast. 90 tablet 1   Multiple Vitamins-Minerals (MULTIVITAMIN WITH MINERALS) tablet Take 1 tablet by mouth daily.     oxybutynin (DITROPAN-XL) 10 MG 24 hr tablet Take 10 mg by mouth daily as needed (overactive bladder).     polyethylene glycol (MIRALAX / GLYCOLAX) 17 g packet Take 17 g by mouth daily. 14 each 0   Vitamin D, Ergocalciferol, (DRISDOL) 1.25 MG (50000 UNIT) CAPS capsule Take 1 capsule (50,000 Units total) by mouth every 7 (seven) days. (Patient taking differently: Take 50,000 Units by mouth every Monday.) 4 capsule 0   amLODipine (NORVASC) 2.5 MG tablet Take 2.5 mg by mouth daily as needed (high blood pressure).     No current facility-administered medications on file prior to visit.    Review of Systems:  As per HPI- otherwise negative.   Physical Examination: Vitals:   04/23/22 0933  BP: (!) 150/92  Pulse: 78  Resp: 18  Temp: 97.6 F (36.4 C)  SpO2: 96%   Vitals:   04/23/22 0933  Weight: 187 lb 9.6 oz (85.1 kg)  Height: 5' 3.5" (1.613 m)   Body mass index is 32.71 kg/m. Ideal Body Weight: Weight in (lb) to have BMI = 25: 143.1  GEN: no acute distress.  Mildly obese, looks well HEENT: Atraumatic, Normocephalic.  Ears and  Nose: No external deformity. CV: RRR, No M/G/R. No JVD. No thrill. No extra heart sounds. PULM: CTA B, no wheezes, crackles, rhonchi. No retractions. No resp. distress. No accessory muscle use. ABD: S, NT, ND, +BS. No rebound. No HSM. EXTR: No c/c/e PSYCH: Normally interactive. Conversant.   Patient has a small wound on her left long finger, states she cut it on something in her purse just now.  No wound care is needed but she does need a tetanus booster  Assessment and Plan: Hypothyroidism, unspecified type - Plan: TSH  Microcytosis  Essential hypertension - Plan: amLODipine (NORVASC) 2.5 MG tablet, Comprehensive metabolic panel  Iron deficiency - Plan: Ferritin, CBC  Open wound of finger of left hand, initial encounter - Plan: Td vaccine greater than or equal to 7yo preservative free IM  Encounter for screening mammogram for malignant neoplasm of breast - Plan: MM 3D SCREEN BREAST BILATERAL  Following up today.  We will check on her thyroid and lab work as above.  History of iron deficiency, she is currently taking an over-the-counter iron.  She is tolerating this well and it does not seem to be worsening her chronic constipation.  She is also working with her gastroenterologist regarding her colonic obstruction  Update tetanus today, mammogram ordered  Noted minimally elevated blood pressure, will have her start back on her amlodipine 2.5 mg.  This was stopped when she was in the hospital last fall, suspect her blood pressure was soft at that time due to acute illness  Signed Lamar Blinks, MD  Received labs as below, message to patient  Results for orders placed or performed in visit on 04/23/22  TSH  Result Value Ref Range   TSH 3.39 0.35 - 5.50 uIU/mL  Ferritin  Result Value Ref  Range   Ferritin 20.8 10.0 - 291.0 ng/mL  CBC  Result Value Ref Range   WBC 4.9 4.0 - 10.5 K/uL   RBC 5.38 (H) 3.87 - 5.11 Mil/uL   Platelets 199.0 150.0 - 400.0 K/uL   Hemoglobin 13.9  12.0 - 15.0 g/dL   HCT 42.8 36.0 - 46.0 %   MCV 79.5 78.0 - 100.0 fl   MCHC 32.4 30.0 - 36.0 g/dL   RDW 17.4 (H) 11.5 - 15.5 %  Comprehensive metabolic panel  Result Value Ref Range   Sodium 142 135 - 145 mEq/L   Potassium 4.4 3.5 - 5.1 mEq/L   Chloride 103 96 - 112 mEq/L   CO2 31 19 - 32 mEq/L   Glucose, Bld 83 70 - 99 mg/dL   BUN 19 6 - 23 mg/dL   Creatinine, Ser 0.77 0.40 - 1.20 mg/dL   Total Bilirubin 0.5 0.2 - 1.2 mg/dL   Alkaline Phosphatase 89 39 - 117 U/L   AST 15 0 - 37 U/L   ALT 17 0 - 35 U/L   Total Protein 6.4 6.0 - 8.3 g/dL   Albumin 3.9 3.5 - 5.2 g/dL   GFR 73.04 >60.00 mL/min   Calcium 9.6 8.4 - 10.5 mg/dL

## 2022-04-23 ENCOUNTER — Encounter: Payer: Self-pay | Admitting: Family Medicine

## 2022-04-23 ENCOUNTER — Ambulatory Visit (INDEPENDENT_AMBULATORY_CARE_PROVIDER_SITE_OTHER): Payer: MEDICARE | Admitting: Family Medicine

## 2022-04-23 VITALS — BP 150/90 | HR 78 | Temp 97.6°F | Resp 18 | Ht 63.5 in | Wt 187.6 lb

## 2022-04-23 DIAGNOSIS — E039 Hypothyroidism, unspecified: Secondary | ICD-10-CM | POA: Diagnosis not present

## 2022-04-23 DIAGNOSIS — S61209A Unspecified open wound of unspecified finger without damage to nail, initial encounter: Secondary | ICD-10-CM

## 2022-04-23 DIAGNOSIS — Z23 Encounter for immunization: Secondary | ICD-10-CM

## 2022-04-23 DIAGNOSIS — Z1231 Encounter for screening mammogram for malignant neoplasm of breast: Secondary | ICD-10-CM | POA: Diagnosis not present

## 2022-04-23 DIAGNOSIS — I10 Essential (primary) hypertension: Secondary | ICD-10-CM

## 2022-04-23 DIAGNOSIS — E611 Iron deficiency: Secondary | ICD-10-CM | POA: Diagnosis not present

## 2022-04-23 DIAGNOSIS — R718 Other abnormality of red blood cells: Secondary | ICD-10-CM

## 2022-04-23 LAB — COMPREHENSIVE METABOLIC PANEL
ALT: 17 U/L (ref 0–35)
AST: 15 U/L (ref 0–37)
Albumin: 3.9 g/dL (ref 3.5–5.2)
Alkaline Phosphatase: 89 U/L (ref 39–117)
BUN: 19 mg/dL (ref 6–23)
CO2: 31 mEq/L (ref 19–32)
Calcium: 9.6 mg/dL (ref 8.4–10.5)
Chloride: 103 mEq/L (ref 96–112)
Creatinine, Ser: 0.77 mg/dL (ref 0.40–1.20)
GFR: 73.04 mL/min (ref >60.00)
Glucose, Bld: 83 mg/dL (ref 70–99)
Potassium: 4.4 mEq/L (ref 3.5–5.1)
Sodium: 142 mEq/L (ref 135–145)
Total Bilirubin: 0.5 mg/dL (ref 0.2–1.2)
Total Protein: 6.4 g/dL (ref 6.0–8.3)

## 2022-04-23 LAB — CBC
HCT: 42.8 % (ref 36.0–46.0)
Hemoglobin: 13.9 g/dL (ref 12.0–15.0)
MCHC: 32.4 g/dL (ref 30.0–36.0)
MCV: 79.5 fl (ref 78.0–100.0)
Platelets: 199 10*3/uL (ref 150.0–400.0)
RBC: 5.38 Mil/uL — ABNORMAL HIGH (ref 3.87–5.11)
RDW: 17.4 % — ABNORMAL HIGH (ref 11.5–15.5)
WBC: 4.9 10*3/uL (ref 4.0–10.5)

## 2022-04-23 LAB — TSH: TSH: 3.39 u[IU]/mL (ref 0.35–5.50)

## 2022-04-23 LAB — FERRITIN: Ferritin: 20.8 ng/mL (ref 10.0–291.0)

## 2022-04-23 MED ORDER — AMLODIPINE BESYLATE 2.5 MG PO TABS: 2.5000 mg | ORAL_TABLET | Freq: Every day | ORAL | 3 refills | Status: DC

## 2022-04-23 NOTE — Patient Instructions (Addendum)
It was good to see again today, I will be in touch with your labs soon as possible.  Keep working on regular exercise  Your blood pressure is slightly high, lets have you start back on your amlodipine 2.5 mg daily  With your history of bowel obstruction, I do not think medications like Mancel Parsons are probably a good idea for you I would recommend getting the shingles vaccine series and a covid booster at your pharmacy Mammogram ordered today, tetanus booster given

## 2022-04-30 ENCOUNTER — Encounter (HOSPITAL_BASED_OUTPATIENT_CLINIC_OR_DEPARTMENT_OTHER): Payer: Self-pay

## 2022-04-30 ENCOUNTER — Ambulatory Visit (HOSPITAL_BASED_OUTPATIENT_CLINIC_OR_DEPARTMENT_OTHER)
Admission: RE | Admit: 2022-04-30 | Discharge: 2022-04-30 | Disposition: A | Discharge status: Home / Self Care | Payer: Medicare Other | Source: Ambulatory Visit | Attending: Family Medicine | Admitting: Family Medicine

## 2022-04-30 DIAGNOSIS — Z1231 Encounter for screening mammogram for malignant neoplasm of breast: Secondary | ICD-10-CM | POA: Insufficient documentation

## 2022-05-01 ENCOUNTER — Ambulatory Visit (INDEPENDENT_AMBULATORY_CARE_PROVIDER_SITE_OTHER): Payer: MEDICARE | Admitting: Internal Medicine

## 2022-05-01 ENCOUNTER — Encounter: Payer: Self-pay | Admitting: Internal Medicine

## 2022-05-01 VITALS — BP 120/80 | HR 64 | Ht 63.5 in | Wt 186.4 lb

## 2022-05-01 DIAGNOSIS — G2581 Restless legs syndrome: Secondary | ICD-10-CM

## 2022-05-01 DIAGNOSIS — K639 Disease of intestine, unspecified: Secondary | ICD-10-CM

## 2022-05-01 DIAGNOSIS — K5730 Diverticulosis of large intestine without perforation or abscess without bleeding: Secondary | ICD-10-CM | POA: Diagnosis not present

## 2022-05-01 DIAGNOSIS — D500 Iron deficiency anemia secondary to blood loss (chronic): Secondary | ICD-10-CM | POA: Diagnosis not present

## 2022-05-01 DIAGNOSIS — K257 Chronic gastric ulcer without hemorrhage or perforation: Secondary | ICD-10-CM | POA: Diagnosis not present

## 2022-05-01 DIAGNOSIS — K56699 Other intestinal obstruction unspecified as to partial versus complete obstruction: Secondary | ICD-10-CM

## 2022-05-01 NOTE — Progress Notes (Signed)
Tisha, Stolar 82 y.o. 08-17-1940 [id number]  Assessment & Plan:   Encounter Diagnoses  Name Primary?   Colon stricture (HCC) Yes   Mucosal abnormality of colon    Cameron lesion, chronic    Iron deficiency anemia due to chronic blood loss    RLS (restless legs syndrome)    Diverticulosis of colon without hemorrhage    Myria is doing well with MiraLAX and we will continue that.  She will try taking iron every other day.  I explained that taking it as needed will not really help her restless leg syndrome but experts believe a ferritin level greater than 100 reduces the symptoms of restless leg syndrome.  If she is unable to tolerate this due to constipation then we can consider parenteral iron.   She will follow-up with me as needed at this point   Subjective:   Chief Complaint: Colon stricture and iron deficiency anemia  HPI 82 year old white woman with a history of iron deficiency anemia thought related to Cameron's erosions in the setting of hiatus hernia, and left colon stricture status post incomplete colonoscopy and then a decompressive colonoscopy January 03, 2022, now status post repeat colonoscopy 03/13/2022.  That procedure revealed a persistent stricture and biopsies showing inflammation that was nonspecific though there was some granulation tissue seen in a rectal polyp and a focal granuloma on rectal biopsy.  There was also a distal sigmoid tubular adenoma.  Prep was fair.  Severe left-sided diverticulosis again as well.  Procedure done with ultraslim colonoscope and CO2 insufflation to prevent air trapping again.  At this point she reports she is doing well taking MiraLAX daily and having a bowel movement every day without significant difficulty.     Latest Ref Rng & Units 04/23/2022   10:18 AM 01/15/2022    2:33 PM 01/04/2022    5:19 AM  CBC  WBC 4.0 - 10.5 K/uL 4.9  6.9  10.0   Hemoglobin 12.0 - 15.0 g/dL 13.9  13.1  12.4   Hematocrit 36.0 -  46.0 % 42.8  40.7  41.0   Platelets 150.0 - 400.0 K/uL 199.0  291.0  230     Lab Results  Component Value Date   FERRITIN 20.8 04/23/2022    Allergies  Allergen Reactions   Acyclovir And Related Other (See Comments)    Reaction not cited by spouse   Tribenzor [Olmesartan-Amlodipine-Hctz] Palpitations and Other (See Comments)   Current Meds  Medication Sig   acetaminophen (TYLENOL 8 HOUR) 650 MG CR tablet Take 1 tablet (650 mg total) by mouth every 8 (eight) hours as needed for pain.   ALPRAZolam (XANAX) 0.25 MG tablet Take 0.25 mg by mouth daily as needed for anxiety or sleep.   amLODipine (NORVASC) 2.5 MG tablet Take 1 tablet (2.5 mg total) by mouth daily.   aspirin EC 81 MG tablet Take 1 tablet (81 mg total) by mouth once a week. Swallow whole. Do not take again until 03/27/22   Biotin 10 MG CAPS Take 10 mg by mouth 3 (three) times a week.   CALCIUM PO Take 1 tablet by mouth daily as needed (restless legs).   Ferrous Sulfate (IRON PO) Take 1 tablet by mouth daily as needed (restless legs).   fluticasone (FLONASE) 50 MCG/ACT nasal spray Place 2 sprays into both nostrils daily.   gabapentin (NEURONTIN) 100 MG capsule Start with 100 mg at bedtime, can increase to 300 mg over  1-2 weeks if needed   irbesartan (AVAPRO) 300 MG tablet Take 300 mg by mouth daily as needed (high blood pressure).   levothyroxine (SYNTHROID) 150 MCG tablet Take 1 tablet (150 mcg total) by mouth daily before breakfast.   Multiple Vitamins-Minerals (MULTIVITAMIN WITH MINERALS) tablet Take 1 tablet by mouth daily.   oxybutynin (DITROPAN-XL) 10 MG 24 hr tablet Take 10 mg by mouth daily as needed (overactive bladder).   polyethylene glycol (MIRALAX / GLYCOLAX) 17 g packet Take 17 g by mouth daily.   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   Chest pain syndrome    COVID    Diarrhea    Diverticulitis    Fatty liver     GERD (gastroesophageal reflux disease)    Hypercholesteremia    Hypertension    Hypothyroidism    Impacted ear wax    Iron deficiency anemia    Joint pain    Large bowel obstruction (HCC)    Obesity    Osteoarthritis    Other fatigue    Palpitations    RLS (restless legs syndrome) 10/31/2020   Shortness of breath    Shortness of breath on exertion    Situational stress    Sleep apnea    no CPAP   Spasm    Thyroid disease    Tubular adenoma of colon    Vitamin D deficiency    Past Surgical History:  Procedure Laterality Date   ABDOMINAL HYSTERECTOMY  1986   APPENDECTOMY     BIOPSY  03/13/2022   Procedure: BIOPSY;  Surgeon: Gatha Mayer, MD;  Location: [facility];  Service: Gastroenterology;;   BOWEL DECOMPRESSION N/A 01/03/2022   Procedure: BOWEL DECOMPRESSION;  Surgeon: Gatha Mayer, MD;  Location: [facility];  Service: Gastroenterology;  Laterality: N/A;   CATARACT EXTRACTION W/ INTRAOCULAR LENS IMPLANT Bilateral 07/21/15, 09/22/15   COLONOSCOPY     COLONOSCOPY N/A 01/03/2022   Procedure: COLONOSCOPY;  Surgeon: Gatha Mayer, MD;  Location: [facility];  Service: Gastroenterology;  Laterality: N/A;   COLONOSCOPY WITH PROPOFOL N/A 03/13/2022   Procedure: COLONOSCOPY WITH PROPOFOL;  Surgeon: Gatha Mayer, MD;  Location: [facility];  Service: Gastroenterology;  Laterality: N/A;   POLYPECTOMY  03/13/2022   Procedure: POLYPECTOMY;  Surgeon: Gatha Mayer, MD;  Location: [facility];  Service: Gastroenterology;;   TOE SURGERY     Unilateral Ooophorectomy     Social History   Social History Narrative   Retired from city of [place] and [place]   Married, 1 daughter   Some EtOH and no drugs/EtOH   family history includes Diabetes in [place]. Elsayed's brother; Heart disease in [place]. Wise's brother, father, and mother; Hyperlipidemia in [place]. Masley's mother; Hypertension in [place]. Trillo's brother and father; Obesity in [place].  Aldama's father; Sleep apnea in [place]. Fugere's father; Sudden death in [place]. Brasel's father.   Review of Systems As per HPI  Objective:   Physical Exam BP 120/80   Pulse 64   Ht 5' 3.5" (1.613 m)   Wt 186 lb 6.4 oz (84.6 kg)   BMI 32.50 kg/m  Data reviewed as per age

## 2022-05-01 NOTE — Patient Instructions (Addendum)
Great to see you today.  Take your Miralax daily.   Take your iron every other day and if you get constipated call us back.   I appreciate the opportunity to care for you. [name], MD, [facility]

## 2022-06-01 ENCOUNTER — Telehealth: Payer: MEDICARE | Admitting: Emergency Medicine

## 2022-06-01 DIAGNOSIS — J019 Acute sinusitis, unspecified: Secondary | ICD-10-CM | POA: Diagnosis not present

## 2022-06-01 MED ORDER — AMOXICILLIN-POT CLAVULANATE 875-125 MG PO TABS: 1.0000 | ORAL_TABLET | Freq: Two times a day (BID) | ORAL | 0 refills | Status: DC

## 2022-06-01 NOTE — Progress Notes (Signed)

## 2022-06-07 ENCOUNTER — Encounter: Payer: Self-pay | Admitting: Family Medicine

## 2022-06-07 ENCOUNTER — Telehealth: Payer: Medicare Other | Admitting: Physician Assistant

## 2022-06-07 DIAGNOSIS — B9689 Other specified bacterial agents as the cause of diseases classified elsewhere: Secondary | ICD-10-CM

## 2022-06-07 MED ORDER — PREDNISONE 10 MG (21) PO TBPK: ORAL_TABLET | ORAL | 0 refills | Status: DC

## 2022-06-07 MED ORDER — BENZONATATE 100 MG PO CAPS: 100.0000 mg | ORAL_CAPSULE | Freq: Three times a day (TID) | ORAL | 0 refills | Status: DC | PRN

## 2022-06-07 NOTE — Progress Notes (Signed)
E-Visit for Cough  We are sorry that you are not feeling well.  Here is how we plan to help!  Based on your presentation I believe you most likely have  A cough due to bacteria.  When patients have a fever and a productive cough with a change in color or increased sputum production, we are concerned about bacterial bronchitis.  If left untreated it can progress to pneumonia.  If your symptoms do not improve with your treatment plan it is important that you contact your provider.  Continue Augmentin (Amox/Clav)   In addition you may use A prescription cough medication called Tessalon Perles . You may take 1-2 capsules every 8 hours as needed for your cough.  Prednisone 10 mg daily for 6 days (see taper instructions below)  Directions for 6 day taper: Day 1: 2 tablets before breakfast, 1 after both lunch & dinner and 2 at bedtime Day 2: 1 tab before breakfast, 1 after both lunch & dinner and 2 at bedtime Day 3: 1 tab at each meal & 1 at bedtime Day 4: 1 tab at breakfast, 1 at lunch, 1 at bedtime Day 5: 1 tab at breakfast & 1 tab at bedtime Day 6: 1 tab at breakfast  From your responses in the eVisit questionnaire you describe inflammation in the upper respiratory tract which is causing a significant cough.  This is commonly called Bronchitis and has four common causes:   Allergies Viral Infections Acid Reflux Bacterial Infection Allergies, viruses and acid reflux are treated by controlling symptoms or eliminating the cause. An example might be a cough caused by taking certain blood pressure medications. You stop the cough by changing the medication. Another example might be a cough caused by acid reflux. Controlling the reflux helps control the cough.  USE OF BRONCHODILATOR ("RESCUE") INHALERS: There is a risk from using your bronchodilator too frequently.  The risk is that over-reliance on a medication which only relaxes the muscles surrounding the breathing tubes can reduce the  effectiveness of medications prescribed to reduce swelling and congestion of the tubes themselves.  Although you feel brief relief from the bronchodilator inhaler, your asthma may actually be worsening with the tubes becoming more swollen and filled with mucus.  This can delay other crucial treatments, such as oral steroid medications. If you need to use a bronchodilator inhaler daily, several times per day, you should discuss this with your provider.  There are probably better treatments that could be used to keep your asthma under control.     HOME CARE Only take medications as instructed by your medical team. Complete the entire course of an antibiotic. Drink plenty of fluids and get plenty of rest. Avoid close contacts especially the very young and the elderly Cover your mouth if you cough or cough into your sleeve. Always remember to wash your hands A steam or ultrasonic humidifier can help congestion.   GET HELP RIGHT AWAY IF: You develop worsening fever. You become short of breath You cough up blood. Your symptoms persist after you have completed your treatment plan MAKE SURE YOU  Understand these instructions. Will watch your condition. Will get help right away if you are not doing well or get worse.    Thank you for choosing an e-visit.  Your e-visit answers were reviewed by a board certified advanced clinical practitioner to complete your personal care plan. Depending upon the condition, your plan could have included both over the counter or prescription medications.  Please review  your pharmacy choice. Make sure the pharmacy is open so you can pick up prescription now. If there is a problem, you may contact your provider through [employer] and have the prescription routed to another pharmacy.  Your safety is important to [place]. If you have drug allergies check your prescription carefully.   For the next 24 hours you can use MyChart to ask questions about today's visit,  request a non-urgent call back, or ask for a work or school excuse. You will get an email in the next two days asking about your experience. I hope that your e-visit has been valuable and will speed your recovery.  I have spent 5 minutes in review of e-visit questionnaire, review and updating patient chart, medical decision making and response to patient.   Margaretann Loveless, PA-C

## 2022-06-26 ENCOUNTER — Other Ambulatory Visit (HOSPITAL_BASED_OUTPATIENT_CLINIC_OR_DEPARTMENT_OTHER): Payer: Self-pay

## 2022-06-26 MED ORDER — SHINGRIX 50 MCG/0.5ML IM SUSR
0.5000 mL | Freq: Once | INTRAMUSCULAR | 1 refills | Status: AC
  Filled 2022-06-26: qty 0.5, 1d supply, fill #0
  Filled 2022-09-18: qty 0.5, 1d supply, fill #1

## 2022-07-17 ENCOUNTER — Other Ambulatory Visit: Payer: Self-pay

## 2022-07-17 MED ORDER — LEVOTHYROXINE SODIUM 150 MCG PO TABS: 150.0000 ug | ORAL_TABLET | Freq: Every day | ORAL | 1 refills | Status: DC

## 2022-08-15 DIAGNOSIS — H3554 Dystrophies primarily involving the retinal pigment epithelium: Secondary | ICD-10-CM | POA: Diagnosis not present

## 2022-08-15 DIAGNOSIS — H35363 Drusen (degenerative) of macula, bilateral: Secondary | ICD-10-CM | POA: Diagnosis not present

## 2022-08-15 DIAGNOSIS — H353131 Nonexudative age-related macular degeneration, bilateral, early dry stage: Secondary | ICD-10-CM | POA: Diagnosis not present

## 2022-08-15 DIAGNOSIS — H16223 Keratoconjunctivitis sicca, not specified as Sjogren's, bilateral: Secondary | ICD-10-CM | POA: Diagnosis not present

## 2022-09-18 ENCOUNTER — Other Ambulatory Visit (HOSPITAL_BASED_OUTPATIENT_CLINIC_OR_DEPARTMENT_OTHER): Payer: Self-pay

## 2022-09-19 ENCOUNTER — Encounter (INDEPENDENT_AMBULATORY_CARE_PROVIDER_SITE_OTHER): Payer: MEDICARE | Admitting: Family Medicine

## 2022-09-19 DIAGNOSIS — R350 Frequency of micturition: Secondary | ICD-10-CM

## 2022-09-20 ENCOUNTER — Other Ambulatory Visit: Payer: Medicare Other

## 2022-09-20 DIAGNOSIS — R350 Frequency of micturition: Secondary | ICD-10-CM

## 2022-09-20 NOTE — Addendum Note (Signed)
Addended by: Abbe Amsterdam C on: 09/20/2022 10:07 AM   Modules accepted: Orders

## 2022-09-23 MED ORDER — OXYBUTYNIN CHLORIDE ER 10 MG PO TB24: 10.0000 mg | ORAL_TABLET | Freq: Every day | ORAL | 1 refills | Status: DC | PRN

## 2022-09-23 NOTE — Addendum Note (Signed)
Addended by: Abbe Amsterdam C on: 09/23/2022 03:26 PM   Modules accepted: Orders

## 2022-09-23 NOTE — Telephone Encounter (Signed)

## 2022-09-24 ENCOUNTER — Other Ambulatory Visit: Payer: Self-pay | Admitting: Family Medicine

## 2022-09-24 ENCOUNTER — Other Ambulatory Visit (HOSPITAL_BASED_OUTPATIENT_CLINIC_OR_DEPARTMENT_OTHER): Payer: Self-pay

## 2022-09-24 MED ORDER — OXYBUTYNIN CHLORIDE ER 10 MG PO TB24
10.0000 mg | ORAL_TABLET | Freq: Every day | ORAL | 1 refills | Status: DC | PRN
  Filled 2022-09-24: qty 90, 90d supply, fill #0

## 2022-09-26 ENCOUNTER — Other Ambulatory Visit (HOSPITAL_BASED_OUTPATIENT_CLINIC_OR_DEPARTMENT_OTHER): Payer: Self-pay

## 2022-10-04 ENCOUNTER — Other Ambulatory Visit (HOSPITAL_BASED_OUTPATIENT_CLINIC_OR_DEPARTMENT_OTHER): Payer: Self-pay

## 2022-10-25 ENCOUNTER — Other Ambulatory Visit (HOSPITAL_BASED_OUTPATIENT_CLINIC_OR_DEPARTMENT_OTHER): Payer: Self-pay

## 2022-10-25 ENCOUNTER — Other Ambulatory Visit: Payer: Self-pay | Admitting: Family Medicine

## 2022-10-25 MED ORDER — ALPRAZOLAM 0.25 MG PO TABS
0.2500 mg | ORAL_TABLET | Freq: Every day | ORAL | 0 refills | Status: DC | PRN
  Filled 2022-10-25: qty 30, 30d supply, fill #0

## 2022-10-31 ENCOUNTER — Encounter: Payer: Self-pay | Admitting: Family Medicine

## 2022-11-05 ENCOUNTER — Encounter: Payer: Self-pay | Admitting: Family Medicine

## 2022-11-07 ENCOUNTER — Other Ambulatory Visit (HOSPITAL_BASED_OUTPATIENT_CLINIC_OR_DEPARTMENT_OTHER): Payer: Self-pay

## 2022-11-07 MED ORDER — INFLUENZA VAC A&B SURF ANT ADJ 0.5 ML IM SUSY
0.5000 mL | PREFILLED_SYRINGE | Freq: Once | INTRAMUSCULAR | 0 refills | Status: AC
  Filled 2022-11-07: qty 0.5, 1d supply, fill #0

## 2022-11-07 MED ORDER — COVID-19 MRNA VAC-TRIS(PFIZER) 30 MCG/0.3ML IM SUSY
0.3000 mL | PREFILLED_SYRINGE | Freq: Once | INTRAMUSCULAR | 0 refills | Status: AC
  Filled 2022-11-07: qty 0.3, 1d supply, fill #0

## 2023-02-21 ENCOUNTER — Other Ambulatory Visit (HOSPITAL_BASED_OUTPATIENT_CLINIC_OR_DEPARTMENT_OTHER): Payer: Self-pay

## 2023-02-21 ENCOUNTER — Other Ambulatory Visit: Payer: Self-pay | Admitting: Family Medicine

## 2023-02-21 MED ORDER — DOXYCYCLINE HYCLATE 100 MG PO CAPS
100.0000 mg | ORAL_CAPSULE | Freq: Two times a day (BID) | ORAL | 0 refills | Status: DC
  Filled 2023-02-21: qty 20, 10d supply, fill #0

## 2023-02-21 NOTE — Progress Notes (Signed)
 Pt seen today when her husband was in for a sick visit.  I gave him a course of doxy for bronchitis/ sinusitis.  Pt notes similar sx developing and would like an rx to hold which I sent in for her   Meds ordered this encounter  Medications   doxycycline  (VIBRAMYCIN ) 100 MG capsule    Sig: Take 1 capsule (100 mg total) by mouth 2 (two) times daily.    Dispense:  20 capsule    Refill:  0

## 2023-03-09 ENCOUNTER — Encounter: Payer: Self-pay | Admitting: Family Medicine

## 2023-03-11 MED ORDER — AMOXICILLIN-POT CLAVULANATE 875-125 MG PO TABS: 1.0000 | ORAL_TABLET | Freq: Two times a day (BID) | ORAL | 0 refills | Status: DC

## 2023-03-11 NOTE — Addendum Note (Signed)
Addended by: Abbe Amsterdam C on: 03/11/2023 03:08 PM   Modules accepted: Orders

## 2023-03-12 ENCOUNTER — Ambulatory Visit: Payer: MEDICARE | Admitting: Physician Assistant

## 2023-04-08 ENCOUNTER — Other Ambulatory Visit (HOSPITAL_BASED_OUTPATIENT_CLINIC_OR_DEPARTMENT_OTHER): Payer: Self-pay | Admitting: Family Medicine

## 2023-04-08 DIAGNOSIS — Z139 Encounter for screening, unspecified: Secondary | ICD-10-CM

## 2023-05-09 ENCOUNTER — Other Ambulatory Visit: Payer: Self-pay | Admitting: Family Medicine

## 2023-05-13 ENCOUNTER — Encounter (HOSPITAL_BASED_OUTPATIENT_CLINIC_OR_DEPARTMENT_OTHER): Payer: Self-pay

## 2023-05-13 ENCOUNTER — Ambulatory Visit (HOSPITAL_BASED_OUTPATIENT_CLINIC_OR_DEPARTMENT_OTHER)
Admission: RE | Admit: 2023-05-13 | Discharge: 2023-05-13 | Disposition: A | Discharge status: Home / Self Care | Payer: MEDICARE | Source: Ambulatory Visit | Attending: Family Medicine | Admitting: Family Medicine

## 2023-05-13 ENCOUNTER — Other Ambulatory Visit: Payer: Self-pay

## 2023-05-13 DIAGNOSIS — Z1231 Encounter for screening mammogram for malignant neoplasm of breast: Secondary | ICD-10-CM | POA: Diagnosis not present

## 2023-05-13 DIAGNOSIS — Z139 Encounter for screening, unspecified: Secondary | ICD-10-CM

## 2023-05-23 DIAGNOSIS — H16223 Keratoconjunctivitis sicca, not specified as Sjogren's, bilateral: Secondary | ICD-10-CM | POA: Diagnosis not present

## 2023-05-23 DIAGNOSIS — H353131 Nonexudative age-related macular degeneration, bilateral, early dry stage: Secondary | ICD-10-CM | POA: Diagnosis not present

## 2023-05-23 DIAGNOSIS — H02834 Dermatochalasis of left upper eyelid: Secondary | ICD-10-CM | POA: Diagnosis not present

## 2023-05-23 DIAGNOSIS — H35363 Drusen (degenerative) of macula, bilateral: Secondary | ICD-10-CM | POA: Diagnosis not present

## 2023-05-23 DIAGNOSIS — H5203 Hypermetropia, bilateral: Secondary | ICD-10-CM | POA: Diagnosis not present

## 2023-05-23 DIAGNOSIS — H52203 Unspecified astigmatism, bilateral: Secondary | ICD-10-CM | POA: Diagnosis not present

## 2023-05-23 DIAGNOSIS — H524 Presbyopia: Secondary | ICD-10-CM | POA: Diagnosis not present

## 2023-05-23 DIAGNOSIS — H02831 Dermatochalasis of right upper eyelid: Secondary | ICD-10-CM | POA: Diagnosis not present

## 2023-05-23 DIAGNOSIS — H43813 Vitreous degeneration, bilateral: Secondary | ICD-10-CM | POA: Diagnosis not present

## 2023-05-23 DIAGNOSIS — H3554 Dystrophies primarily involving the retinal pigment epithelium: Secondary | ICD-10-CM | POA: Diagnosis not present

## 2023-06-06 ENCOUNTER — Telehealth: Payer: Self-pay | Admitting: Family Medicine

## 2023-06-06 NOTE — Telephone Encounter (Signed)
 Copied from CRM [id number]. Topic: Medicare AWV >> Jun 06, 2023 10:48 AM Juliana Ocean wrote: Reason for CRM: Called LVM 06/06/2023 to schedule AWV. Please schedule Virtual or Telehealth visits ONLY  Rosalee Collins; Care Guide Ambulatory Clinical Support [place] [facility] Direct Dial: [phone]

## 2023-06-16 NOTE — Patient Instructions (Incomplete)
 Good to see you again today Recommend a dose of RSV vaccine at your pharmacy- one time, at your convenience  I will be in touch with your labs asap  Let me know how you do with the trazodone for sleep- take about 30 minutes prior to when you would like to be falling asleep

## 2023-06-16 NOTE — Progress Notes (Addendum)
 [facility] [address], [address] [place], [place] [address] [phone]  Date:  06/19/2023   Name:  Toni Jacobs   DOB:  March 06, 1940   MRN:  [id number]  PCP:  Kaylee Partridge, MD    Chief Complaint: Annual Exam   History of Present Illness:  Toni Jacobs is a 83 y.o. very pleasant adult patient who presents with the following:  Patient seen today for a physical exam.  Most recent visit with myself was about 1 year ago History of hypothyroidism, restless leg syndrome, small bowel obstruction in the past with more recent colonic stricture, diverticulitis  Her gastroenterologist is Dr. 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  That procedure revealed a persistent stricture and biopsies showing inflammation that was nonspecific though there was some granulation tissue seen in a rectal polyp and a focal granuloma on rectal biopsy.  There was also a distal sigmoid tubular adenoma.  Prep was fair.  Severe left-sided diverticulosis again as well.  Procedure done with ultraslim colonoscope and CO2 insufflation to prevent air trapping again. At this point she reports she is doing well taking MiraLAX  daily and having a bowel movement every day without significant difficulty.  She has early stage macular degeneration which is being followed by Dr. Tobe Fort with [facility]  Mammogram is up-to-date Bone density can be updated if she would like-  She has completed Shingrix -recommend RSV  They recently took a bus trip and she gained some weight- sheis working on this   [employer] Readings from Last 3 Encounters:  06/19/23 188 lb 6.4 oz  (85.5 kg)  05/01/22 186 lb 6.4 oz (84.6 kg)  04/23/22 187 lb 9.6 oz (85.1 kg)   She notes trouble sleeping for 6 months - she has trouble getting to sleep at night She does tend to take a nap in the afternoon  She may nap for 2-3 hours some days She will have a hard time falling asleep until very late in the night- maybe not until 4 or 5 am She will then get up at about 8 am  She tried some melatonin but it did not help  She wonders about ambien vs trazodone -    Amlodipine  2.5 Alprazolam  as needed- she uses on rare occasion Levothyroxine  150 Oxybutynin  Irbesartain 300 mg - she is taking but says she does not need right now Pt notes she does have some hydrochlorothiazide at home that she uses on occasion for swelling  Update labs today  Never a smoker Rare etoh Patient Active Problem List   Diagnosis Date Noted   Toni Jacobs lesion, chronic 05/01/2022   Iron deficiency anemia due to chronic blood loss 05/01/2022   Colon stricture (HCC) 01/04/2022   Generalized abdominal pain 01/04/2022   Diverticulitis of colon 01/04/2022   SBO (small bowel obstruction) (HCC) 01/03/2022   History of adenomatous polyp of colon 11/30/2021   RLS (restless legs syndrome) 10/31/2020   Vitamin D  deficiency 06/30/2020   Carpal tunnel syndrome, 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   Lax, ligament 08/28/2011   VFD (visual field defect) 08/28/2011   Hypothyroidism 04/01/2009   Primary hypertension 04/01/2009  COUGH 04/01/2009   Diverticulum of large intestine 04/01/2009    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   Chest pain syndrome    COVID    Diarrhea    Diverticulitis    Fatty liver    GERD (gastroesophageal reflux disease)    Hypercholesteremia    Hypertension    Hypothyroidism    Impacted ear wax    Iron deficiency anemia    Joint pain    Large  bowel obstruction (HCC)    Obesity    Osteoarthritis    Other fatigue    Palpitations    RLS (restless legs syndrome) 10/31/2020   Shortness of breath    Shortness of breath on exertion    Situational stress    Sleep apnea    no CPAP   Spasm    Thyroid  disease    Tubular adenoma of colon    Vitamin D  deficiency     Past Surgical History:  Procedure Laterality Date   ABDOMINAL HYSTERECTOMY  1986   APPENDECTOMY     BIOPSY  03/13/2022   Procedure: BIOPSY;  Surgeon: Kenney Peacemaker, MD;  Location: [facility];  Service: Gastroenterology;;   BOWEL DECOMPRESSION N/A 01/03/2022   Procedure: BOWEL DECOMPRESSION;  Surgeon: Kenney Peacemaker, MD;  Location: [facility];  Service: Gastroenterology;  Laterality: N/A;   CATARACT EXTRACTION W/ INTRAOCULAR LENS IMPLANT Bilateral 07/21/15, 09/22/15   COLONOSCOPY     COLONOSCOPY N/A 01/03/2022   Procedure: COLONOSCOPY;  Surgeon: Kenney Peacemaker, MD;  Location: [facility];  Service: Gastroenterology;  Laterality: N/A;   COLONOSCOPY WITH PROPOFOL  N/A 03/13/2022   Procedure: COLONOSCOPY WITH PROPOFOL ;  Surgeon: Kenney Peacemaker, MD;  Location: [facility];  Service: Gastroenterology;  Laterality: N/A;   POLYPECTOMY  03/13/2022   Procedure: POLYPECTOMY;  Surgeon: Kenney Peacemaker, MD;  Location: [facility];  Service: Gastroenterology;;   TOE SURGERY     Unilateral Ooophorectomy      Social History   Tobacco Use   Smoking status: Never   Smokeless tobacco: Never  Vaping Use   Vaping status: Never Used  Substance Use Topics   Alcohol use: Yes    Alcohol/week: 1.0 standard drink of alcohol    Types: 1 Glasses of wine per week    Comment: occ   Drug use: Never    Family History  Problem Relation Age of Onset   Heart disease Mother    Hyperlipidemia Mother    Heart disease Father    Hypertension Father    Sudden death Father    Sleep apnea Father    Obesity Father    Hypertension Brother    Heart disease Brother    Diabetes Brother     Breast cancer Neg Hx    Colon cancer Neg Hx    Esophageal cancer Neg Hx    Rectal cancer Neg Hx    Stomach cancer Neg Hx     Allergies  Allergen Reactions   Acyclovir And Related Other (See Comments)    Reaction not cited by spouse   Tribenzor [Olmesartan-Amlodipine -Hctz] Palpitations and Other (See Comments)    Medication list has been reviewed and updated.  Current Outpatient Medications on File Prior to Visit  Medication Sig Dispense Refill   acetaminophen  (TYLENOL  8 HOUR) 650 MG CR tablet Take 1 tablet (650 mg total) by mouth every 8 (eight) hours as  needed for pain.     ALPRAZolam  (XANAX ) 0.25 MG tablet Take 1 tablet (0.25 mg total) by mouth daily as needed for anxiety or sleep. 30 tablet 0   amLODipine  (NORVASC ) 2.5 MG tablet Take 1 tablet (2.5 mg total) by mouth daily. 90 tablet 3   aspirin  EC 81 MG tablet Take 1 tablet (81 mg total) by mouth once a week. Swallow whole. Do not take again until 03/27/22 30 tablet 12   benzonatate  (TESSALON ) 100 MG capsule Take 1 capsule (100 mg total) by mouth 3 (three) times daily as needed. 30 capsule 0   Biotin 10 MG CAPS Take 10 mg by mouth 3 (three) times a week.     CALCIUM PO Take 1 tablet by mouth daily as needed (restless legs).     Ferrous Sulfate (IRON PO) Take 1 tablet by mouth every other day.     fluticasone  (FLONASE ) 50 MCG/ACT nasal spray Place 2 sprays into both nostrils daily. 16 g 6   gabapentin  (NEURONTIN ) 100 MG capsule Start with 100 mg at bedtime, can increase to 300 mg over 1-2 weeks if needed 90 capsule 3   irbesartan (AVAPRO) 300 MG tablet Take 300 mg by mouth daily as needed (high blood pressure).     levothyroxine  (SYNTHROID ) 150 MCG tablet TAKE 1 TABLET (150 MCG TOTAL) BY MOUTH DAILY BEFORE BREAKFAST. 90 tablet 3   Multiple Vitamins-Minerals (MULTIVITAMIN WITH MINERALS) tablet Take 1 tablet by mouth daily.     oxybutynin  (DITROPAN -XL) 10 MG 24 hr tablet Take 1 tablet (10 mg total) by mouth daily as needed  (overactive bladder). 90 tablet 1   polyethylene glycol (MIRALAX  / GLYCOLAX ) 17 g packet Take 17 g by mouth daily. 14 each 0   No current facility-administered medications on file prior to visit.    Review of Systems:  As per HPI- otherwise negative.   Physical Examination: Vitals:   06/19/23 1317  BP: 132/80  Pulse: 80  Resp: 18  Temp: 98.3 F (36.8 C)  SpO2: 95%   Vitals:   06/19/23 1317  Weight: 188 lb 6.4 oz (85.5 kg)  Height: 5\' 4"  (1.626 m)   Body mass index is 32.34 kg/m. Ideal Body Weight: Weight in (lb) to have BMI = 25: 145.3  GEN: no acute distress. HEENT: Atraumatic, Normocephalic.  Ears and Nose: No external deformity. CV: RRR, No M/G/R. No JVD. No thrill. No extra heart sounds. PULM: CTA B, no wheezes, crackles, rhonchi. No retractions. No resp. distress. No accessory muscle use. ABD: S, NT, ND, +BS. No rebound. No HSM. EXTR: No c/c/e PSYCH: Normally interactive. Conversant.    Assessment and Plan: Physical exam  Hypothyroidism, unspecified type - Plan: TSH  Essential hypertension - Plan: CBC, Comprehensive metabolic panel with GFR, amLODipine  (NORVASC ) 2.5 MG tablet  Iron deficiency - Plan: CBC, Ferritin  Mixed hyperlipidemia - Plan: Lipid panel  Vitamin D  deficiency - Plan: VITAMIN D  25 Hydroxy (Vit-D Deficiency, Fractures)  Screening for diabetes mellitus - Plan: Hemoglobin A1c  Insomnia, unspecified type - Plan: traZODone  (DESYREL ) 50 MG tablet  Anxiety disorder, unspecified type - Plan: ALPRAZolam  (XANAX ) 0.25 MG tablet  Overactive bladder - Plan: oxybutynin  (DITROPAN -XL) 10 MG 24 hr tablet  Estrogen deficiency - Plan: DG Bone Density  Physical exam today.  Encouraged healthy diet and exercise routine Will plan further follow- up pending labs. She is still getting around really well, no concerns except overactive bladder which is better with her ditropan  Order bone density Will have her  try trazodone  for sleep/  also encouraged her  to limit the length of her afternoon nap so she may sleep better at night    Signed Gates Kasal, MD  Received labs as below, message to patient  Results for orders placed or performed in visit on 06/19/23  CBC   Collection Time: 06/19/23  1:52 PM  Result Value Ref Range   WBC 6.4 4.0 - 10.5 K/uL   RBC 4.86 3.87 - 5.11 Mil/uL   Platelets 256.0 150.0 - 400.0 K/uL   Hemoglobin 12.6 12.0 - 15.0 g/dL   HCT 82.9 56.2 - 13.0 %   MCV 81.8 78.0 - 100.0 fl   MCHC 31.7 30.0 - 36.0 g/dL   RDW 86.5 (H) 78.4 - 69.6 %  Comprehensive metabolic panel with GFR   Collection Time: 06/19/23  1:52 PM  Result Value Ref Range   Sodium 140 135 - 145 mEq/L   Potassium 3.9 3.5 - 5.1 mEq/L   Chloride 105 96 - 112 mEq/L   CO2 28 19 - 32 mEq/L   Glucose, Bld 91 70 - 99 mg/dL   BUN 22 6 - 23 mg/dL   Creatinine, Ser 2.95 0.40 - 1.20 mg/dL   Total Bilirubin 0.5 0.2 - 1.2 mg/dL   Alkaline Phosphatase 85 39 - 117 U/L   AST 15 0 - 37 U/L   ALT 14 0 - 35 U/L   Total Protein 7.0 6.0 - 8.3 g/dL   Albumin 4.4 3.5 - 5.2 g/dL   GFR 28.41 (L) >32.44 mL/min   Calcium 9.5 8.4 - 10.5 mg/dL  Hemoglobin W1U   Collection Time: 06/19/23  1:52 PM  Result Value Ref Range   Hgb A1c MFr Bld 5.5 4.6 - 6.5 %  Lipid panel   Collection Time: 06/19/23  1:52 PM  Result Value Ref Range   Cholesterol 189 0 - 200 mg/dL   Triglycerides 272.5 (H) 0.0 - 149.0 mg/dL   HDL 36.64 >40.34 mg/dL   VLDL 74.2 0.0 - 59.5 mg/dL   LDL Cholesterol 638 (H) 0 - 99 mg/dL   Total CHOL/HDL Ratio 4    NonHDL 140.06   TSH   Collection Time: 06/19/23  1:52 PM  Result Value Ref Range   TSH 7.41 (H) 0.35 - 5.50 uIU/mL  Ferritin   Collection Time: 06/19/23  1:52 PM  Result Value Ref Range   Ferritin 15.2 10.0 - 291.0 ng/mL  VITAMIN D  25 Hydroxy (Vit-D Deficiency, Fractures)   Collection Time: 06/19/23  1:52 PM  Result Value Ref Range   VITD 25.85 (L) 30.00 - 100.00 ng/mL

## 2023-06-19 ENCOUNTER — Ambulatory Visit (INDEPENDENT_AMBULATORY_CARE_PROVIDER_SITE_OTHER): Payer: MEDICARE | Admitting: Family Medicine

## 2023-06-19 ENCOUNTER — Other Ambulatory Visit: Payer: Self-pay

## 2023-06-19 ENCOUNTER — Other Ambulatory Visit (HOSPITAL_BASED_OUTPATIENT_CLINIC_OR_DEPARTMENT_OTHER): Payer: Self-pay

## 2023-06-19 VITALS — BP 132/80 | HR 80 | Temp 98.3°F | Resp 18 | Ht 64.0 in | Wt 188.4 lb

## 2023-06-19 DIAGNOSIS — N3281 Overactive bladder: Secondary | ICD-10-CM

## 2023-06-19 DIAGNOSIS — G4700 Insomnia, unspecified: Secondary | ICD-10-CM | POA: Diagnosis not present

## 2023-06-19 DIAGNOSIS — E039 Hypothyroidism, unspecified: Secondary | ICD-10-CM | POA: Diagnosis not present

## 2023-06-19 DIAGNOSIS — E559 Vitamin D deficiency, unspecified: Secondary | ICD-10-CM

## 2023-06-19 DIAGNOSIS — I10 Essential (primary) hypertension: Secondary | ICD-10-CM | POA: Diagnosis not present

## 2023-06-19 DIAGNOSIS — E2839 Other primary ovarian failure: Secondary | ICD-10-CM

## 2023-06-19 DIAGNOSIS — Z131 Encounter for screening for diabetes mellitus: Secondary | ICD-10-CM | POA: Diagnosis not present

## 2023-06-19 DIAGNOSIS — Z0000 Encounter for general adult medical examination without abnormal findings: Secondary | ICD-10-CM

## 2023-06-19 DIAGNOSIS — E611 Iron deficiency: Secondary | ICD-10-CM

## 2023-06-19 DIAGNOSIS — E782 Mixed hyperlipidemia: Secondary | ICD-10-CM

## 2023-06-19 DIAGNOSIS — F419 Anxiety disorder, unspecified: Secondary | ICD-10-CM

## 2023-06-19 MED ORDER — OXYBUTYNIN CHLORIDE ER 10 MG PO TB24
10.0000 mg | ORAL_TABLET | Freq: Every day | ORAL | 1 refills | Status: AC | PRN
  Filled 2023-06-19: qty 90, 90d supply, fill #0

## 2023-06-19 MED ORDER — ALPRAZOLAM 0.25 MG PO TABS
0.2500 mg | ORAL_TABLET | Freq: Every day | ORAL | 0 refills | Status: AC | PRN
  Filled 2023-06-19: qty 30, 30d supply, fill #0

## 2023-06-19 MED ORDER — TRAZODONE HCL 50 MG PO TABS
25.0000 mg | ORAL_TABLET | Freq: Every evening | ORAL | 3 refills | Status: DC | PRN
  Filled 2023-06-19: qty 30, 30d supply, fill #0
  Filled 2023-09-12: qty 30, 30d supply, fill #1
  Filled 2023-12-31: qty 30, 30d supply, fill #2

## 2023-06-19 MED ORDER — AMLODIPINE BESYLATE 2.5 MG PO TABS
2.5000 mg | ORAL_TABLET | Freq: Every day | ORAL | 3 refills | Status: AC
  Filled 2023-06-19: qty 90, 90d supply, fill #0

## 2023-06-20 ENCOUNTER — Encounter: Payer: Self-pay | Admitting: Family Medicine

## 2023-06-20 DIAGNOSIS — E039 Hypothyroidism, unspecified: Secondary | ICD-10-CM

## 2023-06-20 LAB — COMPREHENSIVE METABOLIC PANEL WITH GFR
ALT: 14 U/L (ref 0–35)
AST: 15 U/L (ref 0–37)
Albumin: 4.4 g/dL (ref 3.5–5.2)
Alkaline Phosphatase: 85 U/L (ref 39–117)
BUN: 22 mg/dL (ref 6–23)
CO2: 28 meq/L (ref 19–32)
Calcium: 9.5 mg/dL (ref 8.4–10.5)
Chloride: 105 meq/L (ref 96–112)
Creatinine, Ser: 0.93 mg/dL (ref 0.40–1.20)
GFR: 57.76 mL/min — ABNORMAL LOW (ref >60.00)
Glucose, Bld: 91 mg/dL (ref 70–99)
Potassium: 3.9 meq/L (ref 3.5–5.1)
Sodium: 140 meq/L (ref 135–145)
Total Bilirubin: 0.5 mg/dL (ref 0.2–1.2)
Total Protein: 7 g/dL (ref 6.0–8.3)

## 2023-06-20 LAB — LIPID PANEL
Cholesterol: 189 mg/dL (ref 0–200)
HDL: 48.9 mg/dL (ref >39.00)
LDL Cholesterol: 109 mg/dL — ABNORMAL HIGH (ref 0–99)
NonHDL: 140.06
Total CHOL/HDL Ratio: 4
Triglycerides: 155 mg/dL — ABNORMAL HIGH (ref 0.0–149.0)
VLDL: 31 mg/dL (ref 0.0–40.0)

## 2023-06-20 LAB — CBC
HCT: 39.8 % (ref 36.0–46.0)
Hemoglobin: 12.6 g/dL (ref 12.0–15.0)
MCHC: 31.7 g/dL (ref 30.0–36.0)
MCV: 81.8 fl (ref 78.0–100.0)
Platelets: 256 10*3/uL (ref 150.0–400.0)
RBC: 4.86 Mil/uL (ref 3.87–5.11)
RDW: 16.1 % — ABNORMAL HIGH (ref 11.5–15.5)
WBC: 6.4 10*3/uL (ref 4.0–10.5)

## 2023-06-20 LAB — TSH: TSH: 7.41 u[IU]/mL — ABNORMAL HIGH (ref 0.35–5.50)

## 2023-06-20 LAB — FERRITIN: Ferritin: 15.2 ng/mL (ref 10.0–291.0)

## 2023-06-20 LAB — HEMOGLOBIN A1C: Hgb A1c MFr Bld: 5.5 % (ref 4.6–6.5)

## 2023-06-20 LAB — VITAMIN D 25 HYDROXY (VIT D DEFICIENCY, FRACTURES): VITD: 25.85 ng/mL — ABNORMAL LOW (ref 30.00–100.00)

## 2023-06-26 ENCOUNTER — Telehealth (HOSPITAL_BASED_OUTPATIENT_CLINIC_OR_DEPARTMENT_OTHER): Payer: Self-pay

## 2023-06-27 ENCOUNTER — Telehealth: Payer: Self-pay

## 2023-06-27 NOTE — Telephone Encounter (Signed)
 Spoke w/ Pt- informed that order for bone density was placed on 06/19/23, number to imaging given for Pt to call and schedule.

## 2023-06-27 NOTE — Telephone Encounter (Signed)
 Copied from CRM [id number]. Topic: Clinical - Request for Lab/Test Order >> Jun 27, 2023  2:53 PM Toni Jacobs wrote: Reason for CRM: Patient has requested a bone density test; please follow up with patient when appointment can be scheduled [phone]

## 2023-07-02 ENCOUNTER — Ambulatory Visit (INDEPENDENT_AMBULATORY_CARE_PROVIDER_SITE_OTHER): Payer: MEDICARE | Admitting: *Deleted

## 2023-07-02 VITALS — Ht 64.0 in | Wt 188.0 lb

## 2023-07-02 DIAGNOSIS — Z0000 Encounter for general adult medical examination without abnormal findings: Secondary | ICD-10-CM

## 2023-07-02 NOTE — Progress Notes (Signed)
 Subjective:   Toni Jacobs is a 83 y.o. who presents for a Medicare Wellness preventive visit.  As a reminder, Annual Wellness Visits don't include a physical exam, and some assessments may be limited, especially if this visit is performed virtually. We may recommend an in-person visit if needed.  Visit Complete: Virtual I connected with  Toni Jacobs on 07/02/23 by a audio enabled telemedicine application and verified that I am speaking with the correct person using two identifiers.  Patient Location: Home  Provider Location: Office/Clinic  I discussed the limitations of evaluation and management by telemedicine. The patient expressed understanding and agreed to proceed.  Vital Signs: Because this visit was a virtual/telehealth visit, some criteria may be missing or patient reported. Any vitals not documented were not able to be obtained and vitals that have been documented are patient reported.  VideoDeclined- This patient declined Librarian, academic. Therefore the visit was completed with audio only.  Persons Participating in Visit: Patient.  AWV Questionnaire: No: Patient Medicare AWV questionnaire was not completed prior to this visit.  Cardiac Risk Factors include: advanced age (>30men, >31 women);hypertension;obesity (BMI >30kg/m2)     Objective:     Today's Vitals   07/02/23 1339 07/02/23 1341  Weight: 188 lb (85.3 kg)   Height: 5\' 4"  (1.626 m)   PainSc: 0-No pain 0-No pain   Body mass index is 32.27 kg/m.     07/02/2023    2:13 PM 03/13/2022    7:29 AM 01/03/2022    7:27 PM 01/03/2022    4:12 PM 11/15/2021   10:23 AM  Advanced Directives  Does Patient Have a Medical Advance Directive? Yes No;Yes Yes No Yes  Type of Estate agent of [place];Living will Healthcare Power of Attorney Living will  Living will  Does patient want to make changes to medical advance directive? No - Patient declined  No -  Patient declined  No - Patient declined  Copy of Healthcare Power of Attorney in Chart? Yes - validated most recent copy scanned in chart (See row information) No - copy requested     Would patient like information on creating a medical advance directive?    No - Patient declined     Current Medications (verified) Outpatient Encounter Medications as of 07/02/2023  Medication Sig   acetaminophen  (TYLENOL  8 HOUR) 650 MG CR tablet Take 1 tablet (650 mg total) by mouth every 8 (eight) hours as needed for pain.   ALPRAZolam  (XANAX ) 0.25 MG tablet Take 1 tablet (0.25 mg total) by mouth daily as needed for anxiety or sleep.   amLODipine  (NORVASC ) 2.5 MG tablet Take 1 tablet (2.5 mg total) by mouth daily.   aspirin  EC 81 MG tablet Take 1 tablet (81 mg total) by mouth once a week. Swallow whole. Do not take again until 03/27/22   Biotin 10 MG CAPS Take 10 mg by mouth 3 (three) times a week.   CALCIUM PO Take 1 tablet by mouth daily as needed (restless legs).   Ferrous Sulfate (IRON PO) Take 1 tablet by mouth every other day as needed.   irbesartan (AVAPRO) 300 MG tablet Take 300 mg by mouth daily as needed (high blood pressure).   levothyroxine  (SYNTHROID ) 150 MCG tablet TAKE 1 TABLET (150 MCG TOTAL) BY MOUTH DAILY BEFORE BREAKFAST.   Multiple Vitamins-Minerals (MULTIVITAMIN WITH MINERALS) tablet Take 1 tablet by mouth daily.   oxybutynin  (DITROPAN -XL) 10 MG 24 hr tablet Take 1 tablet (10 mg  total) by mouth daily as needed (overactive bladder).   polyethylene glycol (MIRALAX  / GLYCOLAX ) 17 g packet Take 17 g by mouth daily.   traZODone  (DESYREL ) 50 MG tablet Take 0.5-1 tablets (25-50 mg total) by mouth at bedtime as needed for sleep.   [DISCONTINUED] benzonatate  (TESSALON ) 100 MG capsule Take 1 capsule (100 mg total) by mouth 3 (three) times daily as needed.   [DISCONTINUED] fluticasone  (FLONASE ) 50 MCG/ACT nasal spray Place 2 sprays into both nostrils daily.   [DISCONTINUED] gabapentin  (NEURONTIN ) 100  MG capsule Start with 100 mg at bedtime, can increase to 300 mg over 1-2 weeks if needed (Patient not taking: Reported on 07/02/2023)   No facility-administered encounter medications on file as of 07/02/2023.    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   Chest pain syndrome    COVID    Diarrhea    Diverticulitis    Fatty liver    GERD (gastroesophageal reflux disease)    Hypercholesteremia    Hypertension    Hypothyroidism    Impacted ear wax    Iron deficiency anemia    Joint pain    Large bowel obstruction (HCC)    Obesity    Osteoarthritis    Other fatigue    Palpitations    RLS (restless legs syndrome) 10/31/2020   Shortness of breath    Shortness of breath on exertion    Situational stress    Sleep apnea    no CPAP   Spasm    Thyroid  disease    Tubular adenoma of colon    Vitamin D  deficiency    Past Surgical History:  Procedure Laterality Date   ABDOMINAL HYSTERECTOMY  1986   APPENDECTOMY     BIOPSY  03/13/2022   Procedure: BIOPSY;  Surgeon: Kenney Peacemaker, MD;  Location: [facility];  Service: Gastroenterology;;   BOWEL DECOMPRESSION N/A 01/03/2022   Procedure: BOWEL DECOMPRESSION;  Surgeon: Kenney Peacemaker, MD;  Location: [facility];  Service: Gastroenterology;  Laterality: N/A;   CATARACT EXTRACTION W/ INTRAOCULAR LENS IMPLANT Bilateral 07/21/15, 09/22/15   COLONOSCOPY     COLONOSCOPY N/A 01/03/2022   Procedure: COLONOSCOPY;  Surgeon: Kenney Peacemaker, MD;  Location: [facility];  Service: Gastroenterology;  Laterality: N/A;   COLONOSCOPY WITH PROPOFOL  N/A 03/13/2022   Procedure: COLONOSCOPY WITH PROPOFOL ;  Surgeon: Kenney Peacemaker, MD;  Location: [facility];  Service: Gastroenterology;  Laterality: N/A;   POLYPECTOMY  03/13/2022   Procedure: POLYPECTOMY;  Surgeon:  Kenney Peacemaker, MD;  Location: [facility];  Service: Gastroenterology;;   TOE SURGERY     Unilateral Ooophorectomy     Family History  Problem Relation Age of Onset   Heart disease Mother    Hyperlipidemia Mother    Heart disease Father    Hypertension Father    Sudden death Father    Sleep apnea Father    Obesity Father    Hypertension Brother    Heart disease Brother    Diabetes Brother    Breast cancer Neg Hx    Colon cancer Neg Hx    Esophageal cancer Neg Hx    Rectal cancer Neg Hx    Stomach cancer Neg Hx    Social History   Socioeconomic History   Marital status: Married    Spouse name: Katherina Pan  Number of children: 1   Years of education: Not on file   Highest education level: Not on file  Occupational History    Employer: RETIRED  Tobacco Use   Smoking status: Never   Smokeless tobacco: Never  Vaping Use   Vaping status: Never Used  Substance and Sexual Activity   Alcohol use: Not on file    Comment: rarely, only at [place]   Drug use: Never   Sexual activity: Not Currently  Other Topics Concern   Not on file  Social History Narrative   Retired from city of [place] and [place]   Married, 1 daughter   Some EtOH and no drugs/EtOH   Social Drivers of Corporate investment banker Strain: Low Risk  (07/02/2023)   Overall Financial Resource Strain (CARDIA)    Difficulty of Paying Living Expenses: Not very hard  Food Insecurity: No Food Insecurity (07/02/2023)   Hunger Vital Sign    Worried About Running Out of Food in the Last Year: Never true    Ran Out of Food in the Last Year: Never true  Transportation Needs: No Transportation Needs (07/02/2023)   PRAPARE - Administrator, Civil Service (Medical): No    Lack of Transportation (Non-Medical): No  Physical Activity: Inactive (07/02/2023)   Exercise Vital Sign    Days of Exercise per Week: 0 days    Minutes of Exercise per Session: 0 min  Stress: No Stress Concern Present (07/02/2023)    [employer] of Occupational Health - Occupational Stress Questionnaire    Feeling of Stress : Not at all  Social Connections: Socially Integrated (07/02/2023)   Social Connection and Isolation Panel [NHANES]    Frequency of Communication with Friends and Family: More than three times a week    Frequency of Social Gatherings with Friends and Family: More than three times a week    Attends Religious Services: More than 4 times per year    Active Member of [employer] or Organizations: Yes    Attends Engineer, structural: More than 4 times per year    Marital Status: Married    Tobacco Counseling Counseling given: Not Answered    Clinical Intake:  Pre-visit preparation completed: Yes  Pain : No/denies pain Pain Score: 0-No pain     BMI - recorded: 32.27 Nutritional Status: BMI > 30  Obese (pt states she has been through healthy weight and wellness. previously lost down to 167lb with lifestyle modifications. Has lost motivation.) Nutritional Risks: None Diabetes: No  Lab Results  Component Value Date   HGBA1C 5.5 06/19/2023   HGBA1C 5.5 05/31/2020     What is the last grade level you completed in school?: 12, book keeping classes at [employer]	[place] [employer]  Interpreter Needed?: No  Information entered by :: Susa Engman, CMA   Activities of Daily Living     07/02/2023    1:45 PM  In your present state of health, do you have any difficulty performing the following activities:  Hearing? 0  Vision? 0  Comment normal eye exam last month with Dr Erminio Hazy  Difficulty concentrating or making decisions? 0  Walking or climbing stairs? 0  Dressing or bathing? 0  Doing errands, shopping? 0  Preparing Food and eating ? N  Using the Toilet? N  In the past six months, have you accidently leaked urine? Y  Comment occasional, sometimes wears underpad. doctor is aware and treating for OAB  Do you have problems with loss of  bowel control? N  Managing your Medications? N   Managing your Finances? N  Housekeeping or managing your Housekeeping? N    Patient Care Team: Copland, Skipper Dumas, MD as PCP - General (Family Medicine) Arty Binning, MD (Inactive) as PCP - Cardiology (Cardiology) Glenora Laos, MD as Consulting Physician (Family Medicine) Audery Blazing as Consulting Physician (Ophthalmology)  Indicate any recent Medical Services you may have received from other than [facility] providers in the past year (date may be approximate).     Assessment:    This is a routine wellness examination for [place].  Hearing/Vision screen Hearing Screening - Comments:: Denies difficulty Vision Screening - Comments:: 05/23/23  with Dr Erminio Hazy on [facility] in [place], [place]   Goals Addressed             This Visit's Progress    Patient Stated       She wants to lose weight.  Work on healthy lifestyle changes.       Depression Screen     07/02/2023    2:00 PM 06/19/2023    1:22 PM 04/23/2022    9:43 AM 11/15/2021   10:31 AM 05/31/2020    9:08 AM  PHQ 2/9 Scores  PHQ - 2 Score 0 0 0 1 6  PHQ- 9 Score 1    27    Fall Risk     07/02/2023    2:14 PM 06/19/2023    1:22 PM 04/23/2022    9:42 AM  Fall Risk   Falls in the past year? 0 0 0  Number falls in past yr: 0 0 0  Injury with Fall? 0 0 0  Risk for fall due to : No Fall Risks No Fall Risks No Fall Risks  Follow up Education provided Falls evaluation completed Falls evaluation completed    MEDICARE RISK AT HOME:  Medicare Risk at Home Any stairs in or around the home?: Yes If so, are there any without handrails?: No Home free of loose throw rugs in walkways, pet beds, electrical cords, etc?: No Adequate lighting in your home to reduce risk of falls?: Yes Life alert?: No Use of a cane, walker or w/c?: No Grab bars in the bathroom?: Yes Shower chair or bench in shower?: Yes Elevated toilet seat or a handicapped toilet?: No   Cognitive Function: 6CIT completed        07/02/2023    2:15  PM  6CIT Screen  What month? 0 points  What time? 0 points  Count back from 20 0 points  Months in reverse 2 points  Repeat phrase 2 points    Immunizations Immunization History  Administered Date(s) Administered   Fluad Quad(high Dose 65+) 11/15/2021   Fluad Trivalent(High Dose 65+) 11/07/2022   Influenza, High Dose Seasonal PF 12/10/2016   PFIZER(Purple Top)SARS-COV-2 Vaccination 03/30/2019, 04/23/2019   Pfizer(Comirnaty)Fall Seasonal Vaccine 12 years and older 11/07/2022   Pneumococcal Conjugate-13 09/10/2014   Pneumococcal Polysaccharide-23 06/23/2003, 10/04/2016   Td 04/23/2022   Zoster Recombinant(Shingrix ) 06/26/2022, 09/18/2022    Screening Tests Health Maintenance  Topic Date Due   COVID-19 Vaccine (4 - 2024-25 season) 07/01/2024 (Originally 05/07/2023)   INFLUENZA VACCINE  09/20/2023   MAMMOGRAM  05/12/2024   Medicare Annual Wellness (AWV)  07/01/2024   DTaP/Tdap/Td (2 - Tdap) 04/22/2032   Pneumonia Vaccine 41+ Years old  Completed   DEXA SCAN  Completed   Zoster Vaccines- Shingrix   Completed   HPV VACCINES  Aged Out   Meningococcal  B Vaccine  Aged Out    Health Maintenance There are no preventive care reminders to display for this patient.  Health Maintenance Items Addressed: All health maintenance items up to date.  Additional Screening:  Vision Screening: Recommended annual ophthalmology exams for early detection of glaucoma and other disorders of the eye.  Dental Screening: Recommended annual dental exams for proper oral hygiene  Community Resource Referral / Chronic Care Management: CRR required this visit?  No   CCM required this visit?  No   Plan:    I have personally reviewed and noted the following in the patient's chart:   Medical and social history Use of alcohol, tobacco or illicit drugs  Current medications and supplements including opioid prescriptions. Patient is not currently taking opioid prescriptions. Functional ability and  status Nutritional status Physical activity Advanced directives List of other physicians Hospitalizations, surgeries, and ER visits in previous 12 months Vitals Screenings to include cognitive, depression, and falls Referrals and appointments  In addition, I have reviewed and discussed with patient certain preventive protocols, quality metrics, and best practice recommendations. A written personalized care plan for preventive services as well as general preventive health recommendations were provided to patient.   Susa Engman, CMA   07/02/2023   After Visit Summary: (Declined) Due to this being a telephonic visit, with patients personalized plan was offered to patient but patient Declined AVS at this time   Notes: Nothing significant to report at this time.

## 2023-07-02 NOTE — Patient Instructions (Signed)
 It was nice talking with you today! Continue to work on healthy lifestyle changes as we discussed. I look forward to talking with you next year!

## 2023-08-25 ENCOUNTER — Encounter: Payer: Self-pay | Admitting: Family Medicine

## 2023-08-25 DIAGNOSIS — E039 Hypothyroidism, unspecified: Secondary | ICD-10-CM

## 2023-09-05 ENCOUNTER — Other Ambulatory Visit (INDEPENDENT_AMBULATORY_CARE_PROVIDER_SITE_OTHER): Payer: MEDICARE

## 2023-09-05 ENCOUNTER — Encounter: Payer: Self-pay | Admitting: Family Medicine

## 2023-09-05 ENCOUNTER — Ambulatory Visit (HOSPITAL_BASED_OUTPATIENT_CLINIC_OR_DEPARTMENT_OTHER)
Admission: RE | Admit: 2023-09-05 | Discharge: 2023-09-05 | Disposition: A | Discharge status: Home / Self Care | Payer: MEDICARE | Source: Ambulatory Visit | Attending: Family Medicine | Admitting: Family Medicine

## 2023-09-05 DIAGNOSIS — E039 Hypothyroidism, unspecified: Secondary | ICD-10-CM

## 2023-09-05 DIAGNOSIS — E2839 Other primary ovarian failure: Secondary | ICD-10-CM | POA: Diagnosis not present

## 2023-09-05 LAB — TSH: TSH: 0.56 u[IU]/mL (ref 0.35–5.50)

## 2023-09-06 ENCOUNTER — Encounter: Payer: Self-pay | Admitting: Family Medicine

## 2023-11-28 ENCOUNTER — Other Ambulatory Visit (HOSPITAL_BASED_OUTPATIENT_CLINIC_OR_DEPARTMENT_OTHER): Payer: Self-pay

## 2023-11-28 MED ORDER — FLUZONE HIGH-DOSE 0.5 ML IM SUSY
0.5000 mL | PREFILLED_SYRINGE | Freq: Once | INTRAMUSCULAR | 0 refills | Status: AC
  Filled 2023-11-28: qty 0.5, 1d supply, fill #0

## 2023-12-17 DIAGNOSIS — H04123 Dry eye syndrome of bilateral lacrimal glands: Secondary | ICD-10-CM | POA: Diagnosis not present

## 2023-12-17 DIAGNOSIS — H353131 Nonexudative age-related macular degeneration, bilateral, early dry stage: Secondary | ICD-10-CM | POA: Diagnosis not present

## 2023-12-31 ENCOUNTER — Other Ambulatory Visit (HOSPITAL_BASED_OUTPATIENT_CLINIC_OR_DEPARTMENT_OTHER): Payer: Self-pay

## 2024-02-26 ENCOUNTER — Other Ambulatory Visit: Payer: Self-pay | Admitting: Family Medicine

## 2024-03-03 ENCOUNTER — Encounter: Payer: Self-pay | Admitting: Family Medicine

## 2024-03-09 ENCOUNTER — Other Ambulatory Visit (HOSPITAL_BASED_OUTPATIENT_CLINIC_OR_DEPARTMENT_OTHER): Payer: Self-pay

## 2024-03-09 ENCOUNTER — Other Ambulatory Visit: Payer: Self-pay | Admitting: Family Medicine

## 2024-03-09 DIAGNOSIS — G4700 Insomnia, unspecified: Secondary | ICD-10-CM

## 2024-03-09 MED ORDER — TRAZODONE HCL 50 MG PO TABS
25.0000 mg | ORAL_TABLET | Freq: Every evening | ORAL | 1 refills | Status: AC | PRN
  Filled 2024-03-09: qty 90, 90d supply, fill #0

## 2024-07-02 ENCOUNTER — Ambulatory Visit: Payer: MEDICARE
